# Patient Record
Sex: Female | Born: 1935 | Race: White | Hispanic: No | Marital: Married | Smoking: Never smoker
Health system: Southern US, Community
[De-identification: ages and names within clinical notes are randomized; demographics above are authoritative.]

## PROBLEM LIST (undated history)

## (undated) DIAGNOSIS — G309 Alzheimer's disease, unspecified: Secondary | ICD-10-CM

## (undated) DIAGNOSIS — F0280 Dementia in other diseases classified elsewhere without behavioral disturbance: Secondary | ICD-10-CM

## (undated) DIAGNOSIS — K5900 Constipation, unspecified: Principal | ICD-10-CM

## (undated) DIAGNOSIS — E538 Deficiency of other specified B group vitamins: Secondary | ICD-10-CM

## (undated) HISTORY — DX: Dementia in other diseases classified elsewhere without behavioral disturbance: F02.80

## (undated) HISTORY — DX: Constipation, unspecified: K59.00

## (undated) HISTORY — DX: Deficiency of other specified B group vitamins: E53.8

## (undated) HISTORY — DX: Alzheimer's disease, unspecified: G30.9

---

## 2012-09-27 ENCOUNTER — Non-Acute Institutional Stay (SKILLED_NURSING_FACILITY): Payer: Medicare Other | Admitting: Adult Health

## 2012-09-27 DIAGNOSIS — F29 Unspecified psychosis not due to a substance or known physiological condition: Secondary | ICD-10-CM | POA: Insufficient documentation

## 2012-09-27 DIAGNOSIS — K5900 Constipation, unspecified: Secondary | ICD-10-CM

## 2012-09-27 DIAGNOSIS — E538 Deficiency of other specified B group vitamins: Secondary | ICD-10-CM

## 2012-09-27 DIAGNOSIS — G309 Alzheimer's disease, unspecified: Secondary | ICD-10-CM

## 2012-09-27 DIAGNOSIS — F0280 Dementia in other diseases classified elsewhere without behavioral disturbance: Secondary | ICD-10-CM

## 2012-10-17 ENCOUNTER — Non-Acute Institutional Stay (SKILLED_NURSING_FACILITY): Payer: Medicare Other | Admitting: Internal Medicine

## 2012-10-17 DIAGNOSIS — K5900 Constipation, unspecified: Secondary | ICD-10-CM

## 2012-10-17 DIAGNOSIS — Z79899 Other long term (current) drug therapy: Secondary | ICD-10-CM

## 2012-10-17 DIAGNOSIS — E538 Deficiency of other specified B group vitamins: Secondary | ICD-10-CM

## 2012-10-17 DIAGNOSIS — G309 Alzheimer's disease, unspecified: Secondary | ICD-10-CM

## 2012-11-06 NOTE — Progress Notes (Signed)
Patient ID: Felicia Mcdonald, female   DOB: 1933/01/09, 79 y.o.   MRN: [id number]        PROGRESS NOTE  DATE: 10/17/2012  FACILITY:  [facility]  LEVEL OF CARE: SNF (31)  Routine Visit  CHIEF COMPLAINT:  Manage dementia and vitamin B12 deficiency.    HISTORY OF PRESENT ILLNESS:   REASSESSMENT OF ONGOING PROBLEM(S):  DEMENTIA: The dementia remains stable and continues to function adequately in the current living environment with supervision.  The patient has had little changes in behavior. No complications noted from the medications presently being used. Patient is a poor historian.    VITAMIN B12 DEFICIENCY: The B12 deficiency remains stable.  Staff denies paresthesias or gait & balance problems.  No complications reported from the B12 supplementation.    PAST MEDICAL HISTORY : Reviewed.  No changes.  CURRENT MEDICATIONS: Reviewed per [facility]  REVIEW OF SYSTEMS:  Unobtainable due to dementia.    PHYSICAL EXAMINATION  VS:  T 97       P 76       RR 20      BP 108/60     POX %       WT (Lb) 130  GENERAL: no acute distress, normal body habitus NECK: supple, trachea midline, no neck masses, no thyroid tenderness, no thyromegaly RESPIRATORY: breathing is even & unlabored, BS CTAB CARDIAC: RRR, no murmur,no extra heart sounds, no edema GI: abdomen soft, normal BS, no masses, no tenderness, no hepatomegaly, no splenomegaly PSYCHIATRIC: the patient is alert, disoriented, affect & behavior appropriate  LABS/RADIOLOGY: 07/2012:  Platelets 144, otherwise CBC normal.    CMP normal.   Hemoglobin A1C 5.8.     04/2012:  Depakote level 22.    ASSESSMENT/PLAN:  Alzheimer's dementia.  Advanced.  Vitamin B12 deficiency 266.2.  Continue supplementation.    Constipation.  No problems reported by the staff.    Check Depakote level.   CPT CODE: [id number]

## 2012-11-07 ENCOUNTER — Non-Acute Institutional Stay (SKILLED_NURSING_FACILITY): Payer: Medicare Other | Admitting: Internal Medicine

## 2012-11-07 DIAGNOSIS — K5900 Constipation, unspecified: Secondary | ICD-10-CM

## 2012-11-07 DIAGNOSIS — F0280 Dementia in other diseases classified elsewhere without behavioral disturbance: Secondary | ICD-10-CM

## 2012-11-07 DIAGNOSIS — E538 Deficiency of other specified B group vitamins: Secondary | ICD-10-CM

## 2012-11-07 DIAGNOSIS — Z79899 Other long term (current) drug therapy: Secondary | ICD-10-CM | POA: Insufficient documentation

## 2012-11-07 DIAGNOSIS — G309 Alzheimer's disease, unspecified: Secondary | ICD-10-CM

## 2012-11-07 HISTORY — DX: Deficiency of other specified B group vitamins: E53.8

## 2012-11-07 HISTORY — DX: Dementia in other diseases classified elsewhere, unspecified severity, without behavioral disturbance, psychotic disturbance, mood disturbance, and anxiety: F02.80

## 2012-11-07 HISTORY — DX: Constipation, unspecified: K59.00

## 2012-11-07 NOTE — Progress Notes (Signed)
Patient ID: Felicia Mcdonald, female   DOB: August 03, 1932, 79 y.o.   MRN: [id number]        PROGRESS NOTE  DATE: 11/07/2012  FACILITY:  [facility]  LEVEL OF CARE: SNF (31)  Routine Visit  CHIEF COMPLAINT:  Manage dementia and vitamin B12 deficiency.    HISTORY OF PRESENT ILLNESS:   REASSESSMENT OF ONGOING PROBLEM(S):  DEMENTIA: The dementia remains stable and continues to function adequately in the current living environment with supervision.  The patient has had little changes in behavior. No complications noted from the medications presently being used. Patient is a poor historian.    VITAMIN B12 DEFICIENCY: The B12 deficiency remains stable.  Staff deny paresthesias or gait & balance problems.  No complications reported from the B12 supplementation.    PAST MEDICAL HISTORY : Reviewed.  No changes.  CURRENT MEDICATIONS: Reviewed per [facility]  REVIEW OF SYSTEMS:  Unobtainable due to dementia.    PHYSICAL EXAMINATION  VS:  T 98.1       P 96       RR 18      BP 142/82     POX %       WT (Lb) 127  GENERAL: no acute distress, normal body habitus NECK: supple, trachea midline, no neck masses, no thyroid tenderness, no thyromegaly RESPIRATORY: breathing is even & unlabored, BS CTAB CARDIAC: RRR, no murmur,no extra heart sounds, no edema GI: abdomen soft, normal BS, no masses, no tenderness, no hepatomegaly, no splenomegaly PSYCHIATRIC: the patient is alert, disoriented, affect & behavior appropriate  LABS/RADIOLOGY: 5/14 Depakote level 31 07/2012:  Platelets 144, otherwise CBC normal.    CMP normal.   Hemoglobin A1C 5.8.     04/2012:  Depakote level 22.    ASSESSMENT/PLAN:  Alzheimer's dementia.  Advanced.  Vitamin B12 deficiency 266.2.  Continue supplementation.    Constipation.  No problems reported by the staff.    CPT CODE: 40981

## 2012-12-12 ENCOUNTER — Non-Acute Institutional Stay (SKILLED_NURSING_FACILITY): Payer: Medicare Other | Admitting: Internal Medicine

## 2012-12-12 DIAGNOSIS — G309 Alzheimer's disease, unspecified: Secondary | ICD-10-CM

## 2012-12-12 DIAGNOSIS — E538 Deficiency of other specified B group vitamins: Secondary | ICD-10-CM

## 2012-12-12 DIAGNOSIS — K5900 Constipation, unspecified: Secondary | ICD-10-CM

## 2012-12-14 NOTE — Progress Notes (Signed)
Patient ID: Felicia Mcdonald, female   DOB: March 17, 1933, 79 y.o.   MRN: [id number]        PROGRESS NOTE  DATE: 12/12/2012  FACILITY:  [facility]  LEVEL OF CARE: SNF (31)  Routine Visit  CHIEF COMPLAINT:  Manage dementia and vitamin B12 deficiency.    HISTORY OF PRESENT ILLNESS:   REASSESSMENT OF ONGOING PROBLEM(S):  DEMENTIA: The dementia remains stable and continues to function adequately in the current living environment with supervision.  The patient has had little changes in behavior. No complications noted from the medications presently being used. Patient is a poor historian.    VITAMIN B12 DEFICIENCY: The B12 deficiency remains stable.  Staff deny paresthesias or gait & balance problems.  No complications reported from the B12 supplementation.    PAST MEDICAL HISTORY : Reviewed.  No changes.  CURRENT MEDICATIONS: Reviewed per [facility]  REVIEW OF SYSTEMS:  Unobtainable due to dementia.    PHYSICAL EXAMINATION  VS:  T 98.1       P 80       RR 24      BP 100/68     POX %       WT (Lb) 125  GENERAL: no acute distress, normal body habitus NECK: supple, trachea midline, no neck masses, no thyroid tenderness, no thyromegaly RESPIRATORY: breathing is even & unlabored, BS CTAB CARDIAC: RRR, no murmur,no extra heart sounds, no edema GI: abdomen soft, normal BS, no masses, no tenderness, no hepatomegaly, no splenomegaly PSYCHIATRIC: the patient is alert, disoriented, affect & behavior appropriate  LABS/RADIOLOGY: 5/14 Depakote level 31 07/2012:  Platelets 144, otherwise CBC normal.    CMP normal.   Hemoglobin A1C 5.8.     04/2012:  Depakote level 22.    ASSESSMENT/PLAN:  Alzheimer's dementia.  Advanced.  Vitamin B12 deficiency 266.2.  Continue supplementation.    Constipation.  No problems reported by the staff.    CPT CODE: [id number]

## 2013-01-09 ENCOUNTER — Non-Acute Institutional Stay (SKILLED_NURSING_FACILITY): Payer: Medicare Other | Admitting: Internal Medicine

## 2013-01-09 DIAGNOSIS — G309 Alzheimer's disease, unspecified: Secondary | ICD-10-CM

## 2013-01-09 DIAGNOSIS — E538 Deficiency of other specified B group vitamins: Secondary | ICD-10-CM

## 2013-01-09 DIAGNOSIS — Z79899 Other long term (current) drug therapy: Secondary | ICD-10-CM

## 2013-01-09 DIAGNOSIS — F0280 Dementia in other diseases classified elsewhere without behavioral disturbance: Secondary | ICD-10-CM

## 2013-01-09 DIAGNOSIS — K5900 Constipation, unspecified: Secondary | ICD-10-CM

## 2013-01-09 NOTE — Progress Notes (Signed)
Patient ID: Felicia Mcdonald, female   DOB: Sep 20, 1932, 79 y.o.   MRN: [id number]        PROGRESS NOTE  DATE: 01/09/2013  FACILITY:  [facility]  LEVEL OF CARE: SNF (31)  Routine Visit  CHIEF COMPLAINT:  Manage dementia and vitamin B12 deficiency.    HISTORY OF PRESENT ILLNESS:   REASSESSMENT OF ONGOING PROBLEM(S):  DEMENTIA: The dementia remains stable and continues to function adequately in the current living environment with supervision.  The patient has had little changes in behavior. No complications noted from the medications presently being used. Patient is a poor historian.    VITAMIN B12 DEFICIENCY: The B12 deficiency remains stable.  Staff deny paresthesias or gait & balance problems.  No complications reported from the B12 supplementation.    PAST MEDICAL HISTORY : Reviewed.  No changes.  CURRENT MEDICATIONS: Reviewed per [facility]  REVIEW OF SYSTEMS:  Unobtainable due to dementia.    PHYSICAL EXAMINATION  VS:  T 96.4       P 82       RR 16      BP 102/66     POX %       WT (Lb) 125  GENERAL: no acute distress, normal body habitus NECK: supple, trachea midline, no neck masses, no thyroid tenderness, no thyromegaly RESPIRATORY: breathing is even & unlabored, BS CTAB CARDIAC: RRR, no murmur,no extra heart sounds, no edema GI: abdomen soft, normal BS, no masses, no tenderness, no hepatomegaly, no splenomegaly PSYCHIATRIC: the patient is alert, disoriented, affect & behavior appropriate  LABS/RADIOLOGY: 5/14 Depakote level 31 07/2012:  Platelets 144, otherwise CBC normal.    CMP normal.   Hemoglobin A1C 5.8.     04/2012:  Depakote level 22.    ASSESSMENT/PLAN:  Alzheimer's dementia.  Advanced.  Vitamin B12 deficiency 266.2.  Continue supplementation.    Constipation.  No problems reported by the staff.    Check cbc & cmp.  CPT CODE: [id number]

## 2013-02-12 ENCOUNTER — Non-Acute Institutional Stay (SKILLED_NURSING_FACILITY): Payer: Medicare Other | Admitting: Adult Health

## 2013-02-12 DIAGNOSIS — E538 Deficiency of other specified B group vitamins: Secondary | ICD-10-CM

## 2013-02-12 DIAGNOSIS — K5900 Constipation, unspecified: Secondary | ICD-10-CM

## 2013-02-12 DIAGNOSIS — F29 Unspecified psychosis not due to a substance or known physiological condition: Secondary | ICD-10-CM

## 2013-02-12 DIAGNOSIS — F0280 Dementia in other diseases classified elsewhere without behavioral disturbance: Secondary | ICD-10-CM

## 2013-03-14 ENCOUNTER — Non-Acute Institutional Stay (SKILLED_NURSING_FACILITY): Payer: Medicare Other | Admitting: Internal Medicine

## 2013-03-14 DIAGNOSIS — F0280 Dementia in other diseases classified elsewhere without behavioral disturbance: Secondary | ICD-10-CM

## 2013-03-14 DIAGNOSIS — K5900 Constipation, unspecified: Secondary | ICD-10-CM

## 2013-03-14 DIAGNOSIS — E538 Deficiency of other specified B group vitamins: Secondary | ICD-10-CM

## 2013-03-22 NOTE — Progress Notes (Signed)
Patient ID: Felicia Mcdonald, female   DOB: Jan 08, 1933, 79 y.o.   MRN: [id number]        PROGRESS NOTE  DATE: 03/14/2013  FACILITY:  [facility]  LEVEL OF CARE: SNF (31)  Routine Visit  CHIEF COMPLAINT:  Manage dementia and vitamin B12 deficiency.    HISTORY OF PRESENT ILLNESS:   REASSESSMENT OF ONGOING PROBLEM(S):  DEMENTIA: The dementia remains stable and continues to function adequately in the current living environment with supervision.  The patient has had little changes in behavior. No complications noted from the medications presently being used. Patient is a poor historian.    VITAMIN B12 DEFICIENCY: The B12 deficiency remains stable.  Staff deny paresthesias or gait & balance problems.  No complications reported from the B12 supplementation.    PAST MEDICAL HISTORY : Reviewed.  No changes.  CURRENT MEDICATIONS: Reviewed per [facility]  REVIEW OF SYSTEMS:  Unobtainable due to dementia.    PHYSICAL EXAMINATION  VS:  T 97.3       P 68       RR 16      BP 110/76     POX %       WT (Lb) 124  GENERAL: no acute distress, normal body habitus NECK: supple, trachea midline, no neck masses, no thyroid tenderness, no thyromegaly RESPIRATORY: breathing is even & unlabored, BS CTAB CARDIAC: RRR, no murmur,no extra heart sounds, no edema GI: abdomen soft, normal BS, no masses, no tenderness, no hepatomegaly, no splenomegaly PSYCHIATRIC: the patient is alert, disoriented, affect & behavior appropriate  LABS/RADIOLOGY:  8/14 cbc & cmp nl, HbA1c 6.3 5/14 Depakote level 31 07/2012:  Platelets 144, otherwise CBC normal.    CMP normal.   Hemoglobin A1C 5.8.     04/2012:  Depakote level 22.    ASSESSMENT/PLAN:  Alzheimer's dementia.  Advanced.  Vitamin B12 deficiency 266.2.  Continue supplementation.    Constipation.  No problems reported by the staff.    CPT CODE: [id number]

## 2013-04-11 ENCOUNTER — Non-Acute Institutional Stay (SKILLED_NURSING_FACILITY): Payer: Medicare Other | Admitting: Internal Medicine

## 2013-04-11 DIAGNOSIS — K5900 Constipation, unspecified: Secondary | ICD-10-CM

## 2013-04-11 DIAGNOSIS — E538 Deficiency of other specified B group vitamins: Secondary | ICD-10-CM

## 2013-04-11 DIAGNOSIS — F0280 Dementia in other diseases classified elsewhere without behavioral disturbance: Secondary | ICD-10-CM

## 2013-04-11 DIAGNOSIS — R21 Rash and other nonspecific skin eruption: Secondary | ICD-10-CM

## 2013-04-12 DIAGNOSIS — R21 Rash and other nonspecific skin eruption: Secondary | ICD-10-CM | POA: Insufficient documentation

## 2013-04-12 NOTE — Progress Notes (Signed)
Patient ID: Felicia Mcdonald, female   DOB: 04/01/1933, 79 y.o.   MRN: [id number]        PROGRESS NOTE  DATE: 04/11/2013  FACILITY:  [facility]  LEVEL OF CARE: SNF (31)  Routine Visit  CHIEF COMPLAINT:  Manage dementia, facial rash and vitamin B12 deficiency.    HISTORY OF PRESENT ILLNESS:   REASSESSMENT OF ONGOING PROBLEM(S):  FACIAL RASH: new problem.  Per staff pt has been having a rash on her face x several days.  No itching or other discomfort reported.  Staff cannot identify precipitating or alleviating factors.  There is no temporal relationship.  DEMENTIA: The dementia remains stable and continues to function adequately in the current living environment with supervision.  The patient has had little changes in behavior. No complications noted from the medications presently being used. Patient is a poor historian.    VITAMIN B12 DEFICIENCY: The B12 deficiency remains stable.  Staff deny paresthesias or gait & balance problems.  No complications reported from the B12 supplementation.    PAST MEDICAL HISTORY : Reviewed.  No changes.  CURRENT MEDICATIONS: Reviewed per [facility]  REVIEW OF SYSTEMS:  Unobtainable due to dementia.    PHYSICAL EXAMINATION  VS:  T 98.1       P 80       RR 18      BP 112/80     POX %       WT (Lb) 124  GENERAL: no acute distress, normal body habitus EYES: normal conjunctivae, normal sclerae, no discharge SKIN: erythematous 1mm size lesions on face LYMPHATICS: no cervical LAN, no supraclavicular LAN NECK: supple, trachea midline, no neck masses, no thyroid tenderness, no thyromegaly RESPIRATORY: breathing is even & unlabored, BS CTAB CARDIAC: RRR, no murmur,no extra heart sounds, no edema GI: abdomen soft, normal BS, no masses, no tenderness, no hepatomegaly, no splenomegaly PSYCHIATRIC: the patient is alert, disoriented, affect & behavior appropriate  LABS/RADIOLOGY:  8/14 cbc & cmp nl, HbA1c 6.3 5/14 Depakote level 31 07/2012:   Platelets 144, otherwise CBC normal.    CMP normal.   Hemoglobin A1C 5.8.     04/2012:  Depakote level 22.    ASSESSMENT/PLAN:  Alzheimer's dementia.  Advanced.  Rash.  New problem.  No discomfort.  Unlikely from meds.  Vitamin B12 deficiency 266.2.  Continue supplementation.    Constipation.  No problems reported by the staff.    CPT CODE: 40981

## 2013-05-06 ENCOUNTER — Encounter: Payer: Self-pay | Admitting: Adult Health

## 2013-05-06 NOTE — Progress Notes (Signed)
Patient ID: Felicia Mcdonald, female   DOB: 27-Nov-1932, 79 y.o.   MRN: [id number]     [facility]  No Known Allergies   Chief Complaint  Patient presents with  . Medical Managment of Chronic Issues    HPI:  She is being seen for the management of her chronic illnesses. Overall her status has not had a recent change. There are no concerns being voiced by the nursing staff at this time. She is unable to fully participate in the hpi or ros at this time.   Past Medical History  Diagnosis Date  . Unspecified constipation 11/07/2012  . Alzheimer's disease 11/07/2012  . Other B-complex deficiencies 11/07/2012    No past surgical history on file.     VITAL SIGNS BP 118/75  Pulse 69  Wt 130 lb (58.968 kg)   Patient's Medications  New Prescriptions   No medications on file  Previous Medications   CYANOCOBALAMIN (VITAMIN B-12 IJ)    Inject 1 mL as directed every 30 (thirty) days.   DIVALPROEX (DEPAKOTE SPRINKLE) 125 MG CAPSULE    Take 250 mg by mouth at bedtime.   DOCUSATE SODIUM (COLACE) 100 MG CAPSULE    Take 100 mg by mouth daily.   QUETIAPINE (SEROQUEL) 50 MG TABLET    Take 50 mg by mouth 2 (two) times daily.  Modified Medications   No medications on file  Discontinued Medications   No medications on file    SIGNIFICANT DIAGNOSTIC EXAMS  LABS REVIEWED:  07-12-12: wbc 5.2; hgb 11.2; hct 34.1; mcv 90; plt 144; glucose 95; bun 20; creat0.95; k+4.9; na++144 Liver normal albumin 4.0; hgb a1c 5.8    Review of Systems  Unable to perform ROS   Physical Exam  Constitutional: She appears well-nourished. No distress.  Neck: Neck supple. No JVD present.  Cardiovascular: Normal rate, regular rhythm and intact distal pulses.   GI: Soft. Bowel sounds are normal. She exhibits no distension. There is no tenderness.  Musculoskeletal: She exhibits no edema.  Is able to move extremities  Neurological: She is alert.  Skin: Skin is warm and dry. She is not diaphoretic.      ASSESSMENT/PLAN  1. Alzheimer's disease: she is without change in her status: will continue she is preset not taking medications for this disease process will continue to monitor her status and will make changes as indicated.   2. Psychosis: no reports of behavorial issues present. Will continue her seroquel 50 mg twice daily and will continue depakote 250 mg nightly to help stabilize mood and will monitor  3. Constipation: will continue colace  4. B12 deficiency will continue monthly injections

## 2013-05-09 ENCOUNTER — Non-Acute Institutional Stay (SKILLED_NURSING_FACILITY): Payer: Medicare Other | Admitting: Internal Medicine

## 2013-05-09 DIAGNOSIS — F0280 Dementia in other diseases classified elsewhere without behavioral disturbance: Secondary | ICD-10-CM

## 2013-05-09 DIAGNOSIS — K5900 Constipation, unspecified: Secondary | ICD-10-CM

## 2013-05-09 DIAGNOSIS — E538 Deficiency of other specified B group vitamins: Secondary | ICD-10-CM

## 2013-05-09 DIAGNOSIS — Z79899 Other long term (current) drug therapy: Secondary | ICD-10-CM

## 2013-05-10 ENCOUNTER — Encounter: Payer: Self-pay | Admitting: Internal Medicine

## 2013-05-10 NOTE — Progress Notes (Signed)
Patient ID: Felicia Mcdonald, female   DOB: [birth year], 79 y.o.   MRN: [id number]        PROGRESS NOTE  DATE: 05/09/2013  FACILITY:  [facility]  LEVEL OF CARE: SNF (31)  Routine Visit  CHIEF COMPLAINT:  Manage dementia, and vitamin B12 deficiency.    HISTORY OF PRESENT ILLNESS:   REASSESSMENT OF ONGOING PROBLEM(S):  DEMENTIA: The dementia remains stable and continues to function adequately in the current living environment with supervision.  The patient has had little changes in behavior. No complications noted from the medications presently being used. Patient is a poor historian.    VITAMIN B12 DEFICIENCY: The B12 deficiency remains stable.  Staff deny paresthesias or gait & balance problems.  No complications reported from the B12 supplementation.    PAST MEDICAL HISTORY : Reviewed.  No changes.  CURRENT MEDICATIONS: Reviewed per [facility]  REVIEW OF SYSTEMS:  Unobtainable due to dementia.    PHYSICAL EXAMINATION  VS:  T 96.7       P 74    RR 18      BP 110/84     POX %       WT (Lb) 124  GENERAL: no acute distress, normal body habitus NECK: supple, trachea midline, no neck masses, no thyroid tenderness, no thyromegaly RESPIRATORY: breathing is even & unlabored, BS CTAB CARDIAC: RRR, no murmur,no extra heart sounds, no edema GI: abdomen soft, normal BS, no masses, no tenderness, no hepatomegaly, no splenomegaly PSYCHIATRIC: the patient is alert, disoriented, affect & behavior appropriate  LABS/RADIOLOGY:  8/14 cbc & cmp nl, HbA1c 6.3 5/14 Depakote level 31 07/2012:  Platelets 144, otherwise CBC normal.    CMP normal.   Hemoglobin A1C 5.8.     04/2012:  Depakote level 22.    ASSESSMENT/PLAN:  Alzheimer's dementia.  Advanced. Seroquel was decreased.  Vitamin B12 deficiency 266.2.  Continue supplementation.    Constipation.  No problems reported by the staff.    Check Depakote level.  CPT CODE: [id number]

## 2013-05-16 ENCOUNTER — Encounter: Payer: Self-pay | Admitting: Adult Health

## 2013-05-16 NOTE — Progress Notes (Signed)
Patient ID: Felicia Mcdonald, female   DOB: 1932-12-18, 79 y.o.   MRN: [id number]     [facility]  No Known Allergies  Chief Complaint  Patient presents with  . Medical Managment of Chronic Issues    HPI  She is being seen for the management of her chronic illnesses. There are no concerns being voiced by the nursing staff at this time. She is unable to fully participate in the hpi or ros. There has been no significant change in her recent status.   Past Medical History  Diagnosis Date  . Unspecified constipation 11/07/2012  . Alzheimer's disease 11/07/2012  . Other B-complex deficiencies 11/07/2012    No past surgical history on file.  Filed Vitals:   02/12/13 2101  BP: 120/76  Pulse: 70  Height: 5\' 1"  (1.549 m)  Weight: 124 lb (56.246 kg)    MEDICATIONS  Colace daily seroquel 50 mg twice daily depakote sprinkles 250 mg nightly Vit b injection monthly   SIGNIFICANT DIAGNOSTIC EXAMS  LABS REVIEWED:  07-12-12: wbc 5.2; hgb 11.2; hct 34.1; mcv 90; plt 144; glucose 95; bun 20; creat0.95; k+4.9; na++144 Liver normal albumin 4.0; hgb a1c 5.8  10-25-12: depakote 31 01-16-13: wbc 5.3; hgb 11.6; hct 34.7; mcv 87 plt 212; glucose 84; bun 16; creat 0.97; k+4.4; na++ 144; liver normal albumin 4.2; hgb a1c 6.3    Review of Systems  Unable to perform ROS   Physical Exam  Constitutional: She appears well-nourished. No distress.  Neck: Neck supple. No JVD present.  Cardiovascular: Normal rate, regular rhythm and intact distal pulses.   GI: Soft. Bowel sounds are normal. She exhibits no distension. There is no tenderness.  Musculoskeletal: She exhibits no edema.  Is able to move extremities  Neurological: She is alert.  Skin: Skin is warm and dry. She is not diaphoretic.     ASSESSMENT/PLAN  1. Alzheimer's disease: she is without change in her status: will continue she is preset not taking medications for this disease process will continue to monitor her status and will make  changes as indicated.   2. Psychosis: no reports of behavorial issues present. Will continue her seroquel 50 mg twice daily and will continue depakote 250 mg nightly to help stabilize mood and will monitor  3. Constipation: will continue colace daily   4. B12 deficiency will continue monthly injections

## 2013-06-03 ENCOUNTER — Non-Acute Institutional Stay (SKILLED_NURSING_FACILITY): Payer: Medicare Other | Admitting: Family

## 2013-06-03 ENCOUNTER — Encounter: Payer: Self-pay | Admitting: Family

## 2013-06-03 DIAGNOSIS — K5900 Constipation, unspecified: Secondary | ICD-10-CM

## 2013-06-03 DIAGNOSIS — F0280 Dementia in other diseases classified elsewhere without behavioral disturbance: Secondary | ICD-10-CM

## 2013-06-03 DIAGNOSIS — E538 Deficiency of other specified B group vitamins: Secondary | ICD-10-CM

## 2013-06-03 NOTE — Progress Notes (Signed)
    Patient ID: Felicia Mcdonald, female   DOB: 28-May-1933, 79 y.o.   MRN: [id number]  Date: 06/03/13 Facility: [facility]  Code Status:  DNR  Chief Complaint  Patient presents with  . Medical Managment of Chronic Issues    Routine Visit    HPI: Pt is being followed for medical management of chronic illnesses. Health care team and pt denies issues/ concerns at present      No Known Allergies    Medication List       This list is accurate as of: 06/03/13  4:35 PM.  Always use your most recent med list.               divalproex 125 MG capsule  Commonly known as:  DEPAKOTE SPRINKLE  Take 250 mg by mouth at bedtime.     docusate sodium 100 MG capsule  Commonly known as:  COLACE  Take 100 mg by mouth daily.     QUEtiapine 50 MG tablet  Commonly known as:  SEROQUEL  Take 50 mg by mouth 2 (two) times daily.     VITAMIN B-12 IJ  Inject 1 mL as directed every 30 (thirty) days.         DATA REVIEWED  Laboratory Studies: 05/15/13-Valproic 24 01/16/13-WBC 5.3, Hemoglobin 11.6, Hematocrit 34.7, Platelets 212, BUN 16, Creatinine 0.97, Na 144, K 4.4, Cl 104, Ca 9.5 Past Medical History  Diagnosis Date  . Unspecified constipation 11/07/2012  . Alzheimer's disease 11/07/2012  . Other B-complex deficiencies 11/07/2012   Review of Systems  Unable to perform ROS: dementia     Physical Exam Filed Vitals:   06/03/13 1631  BP: 128/84  Pulse: 76  Temp: 97.9 F (36.6 C)  Resp: 16   There is no weight on file to calculate BMI. Physical Exam  Cardiovascular: Normal rate, regular rhythm and normal heart sounds.   Pulmonary/Chest: Effort normal and breath sounds normal.  Neurological: She is alert. GCS eye subscore is 4. GCS verbal subscore is 4. GCS motor subscore is 6.  Pleasantly confused  Skin: Skin is warm, dry and intact.    ASSESSMENT/PLAN  Vitamin B12 def.-obtain Vitamin B12/ folate , will continue supplementation Depression-will continue Seroquel; pt is stable   Mood Disorder-will cont. Depakote; pt is stable   Follow up:prn

## 2013-06-13 ENCOUNTER — Non-Acute Institutional Stay (SKILLED_NURSING_FACILITY): Payer: Medicare Other | Admitting: Internal Medicine

## 2013-06-13 DIAGNOSIS — E559 Vitamin D deficiency, unspecified: Secondary | ICD-10-CM

## 2013-06-14 DIAGNOSIS — E559 Vitamin D deficiency, unspecified: Secondary | ICD-10-CM | POA: Insufficient documentation

## 2013-06-14 NOTE — Progress Notes (Signed)
         PROGRESS NOTE  DATE: 06/13/2013  FACILITY:  [facility]  LEVEL OF CARE: SNF (31)  Acute Visit  CHIEF COMPLAINT:  Manage vitamin D deficiency  HISTORY OF PRESENT ILLNESS: I was requested by the staff to assess the patient regarding above problem(s):  In 12-14 vitamin D level was 17.8. Patient is not on vitamin D supplementation. She is a poor historian due to dementia. Staff did not report any acute symptoms.  PAST MEDICAL HISTORY : Reviewed.  No changes.  [facility]  PHYSICAL EXAMINATION  GENERAL: no acute distress, normal body habitus RESPIRATORY: breathing is even & unlabored, BS CTAB CARDIAC: RRR, no murmur,no extra heart sounds, no edema  LABS/RADIOLOGY: See history of present illness  ASSESSMENT/PLAN:  Vitamin D deficiency-new problem. Start vitamin D2 50,000 units q. monthly.  CPT CODE: [id number]

## 2013-07-11 ENCOUNTER — Non-Acute Institutional Stay (SKILLED_NURSING_FACILITY): Payer: Medicare Other | Admitting: Internal Medicine

## 2013-07-11 DIAGNOSIS — E538 Deficiency of other specified B group vitamins: Secondary | ICD-10-CM

## 2013-07-11 DIAGNOSIS — K5900 Constipation, unspecified: Secondary | ICD-10-CM

## 2013-07-11 DIAGNOSIS — E7800 Pure hypercholesterolemia, unspecified: Secondary | ICD-10-CM

## 2013-07-11 DIAGNOSIS — F0280 Dementia in other diseases classified elsewhere without behavioral disturbance: Secondary | ICD-10-CM

## 2013-07-11 DIAGNOSIS — G309 Alzheimer's disease, unspecified: Secondary | ICD-10-CM

## 2013-07-12 DIAGNOSIS — E7800 Pure hypercholesterolemia, unspecified: Secondary | ICD-10-CM | POA: Insufficient documentation

## 2013-07-12 NOTE — Progress Notes (Signed)
Patient ID: Felicia CorriganBetty Mcdonald, female   DOB: 1932-06-09, 80 y.o.   MRN: [id number]         PROGRESS NOTE  DATE: 07/11/2013  [facility]  LEVEL OF CARE: SNF (31)  Routine Visit  CHIEF COMPLAINT:  Manage hyperlipidemia, dementia, and vitamin B12 deficiency.    HISTORY OF PRESENT ILLNESS:   REASSESSMENT OF ONGOING PROBLEM(S):  DEMENTIA: The dementia remains stable and continues to function adequately in the current living environment with supervision.  The patient has had little changes in behavior. No complications noted from the medications presently being used. Patient is a poor historian.    VITAMIN B12 DEFICIENCY: The B12 deficiency remains stable.  Staff deny paresthesias or gait & balance problems.  No complications reported from the B12 supplementation.    HYPERLIPIDEMIA: On 07-10-13 total cholesterol 232, triglycerides 174, HDL 36, LDL 161. Patient is currently not on any lipid lowering agents.  PAST MEDICAL HISTORY : Reviewed.  No changes.  [facility]  REVIEW OF SYSTEMS:  Unobtainable due to dementia.    PHYSICAL EXAMINATION  VS:  T 97.1       P 66    RR 18      BP 121/80       WT (Lb) 122  GENERAL: no acute distress, normal body habitus EYES: Normal sclerae, normal conjunctivae, no discharge NECK: supple, trachea midline, no neck masses, no thyroid tenderness, no thyromegaly LYMPHATICS: No cervical lymphadenopathy, no supraclavicular lymphadenopathy RESPIRATORY: breathing is even & unlabored, BS CTAB CARDIAC: RRR, no murmur,no extra heart sounds, no edema GI: abdomen soft, normal BS, no masses, no tenderness, no hepatomegaly, no splenomegaly PSYCHIATRIC: the patient is alert, disoriented, affect & behavior appropriate  LABS/RADIOLOGY: 12-14 vitamin D level 17.8, vitamin B12 level 379, folate level 12, Depakote level 24 8/14 cbc & cmp nl, HbA1c 6.3 5/14 Depakote level 31 07/2012:  Platelets 144, otherwise CBC normal.     CMP normal.   Hemoglobin A1C 5.8.     04/2012:  Depakote level 22.    ASSESSMENT/PLAN:       Hyperlipidemia-due to advanced age and dementia will not initiate treatment. Request to discontinue lipid panel checks.  Alzheimer's dementia.  Advanced.   Vitamin B12 deficiency 266.2.  Continue supplementation.    Constipation.  No problems reported by the staff.    Vitamin D deficiency-vitamin D was started  Check CBC and CMP  CPT CODE: [id number]

## 2013-07-15 ENCOUNTER — Encounter: Payer: Self-pay | Admitting: *Deleted

## 2013-07-23 ENCOUNTER — Non-Acute Institutional Stay (SKILLED_NURSING_FACILITY): Payer: Medicare Other | Admitting: Internal Medicine

## 2013-07-23 DIAGNOSIS — D638 Anemia in other chronic diseases classified elsewhere: Secondary | ICD-10-CM

## 2013-07-30 NOTE — Progress Notes (Signed)
Patient ID: Felicia CorriganBetty Kertz, female   DOB: [birth year], 80 y.o.   MRN: [id number]          PROGRESS NOTE  DATE: 07/23/2013    [facility]  LEVEL OF CARE: SNF (31)  Acute Visit  CHIEF COMPLAINT:  Manage anemia of chronic disease.    HISTORY OF PRESENT ILLNESS: I was requested by the staff to assess the patient regarding above problem(s):  ANEMIA: The anemia has been stable. The staff denies fatigue, melena or hematochezia.  The patient is currently not on iron.   On 07/17/2013:  Hemoglobin 11, MCV 89.  In 01/2013:  Hemoglobin 11.6.  Patient is a poor historian due to dementia.    PAST MEDICAL HISTORY : Reviewed.  No changes.  [facility]S: [facility]  PHYSICAL EXAMINATION  VS:  T 97.1      P 66     RR 18     BP 121/80        WT (Lb) 122     GENERAL: no acute distress, normal body habitus RESPIRATORY: breathing is even & unlabored, BS CTAB CARDIAC: RRR, no murmur,no extra heart sounds, no edema  ASSESSMENT/PLAN:  Anemia of chronic disease.  Unstable problem.  Hemoglobin declined.  We will monitor.    CPT CODE: 3086599307    Angela CoxGayani Y Bowen Kia, MD [facility] [phone]

## 2013-07-31 DIAGNOSIS — D638 Anemia in other chronic diseases classified elsewhere: Secondary | ICD-10-CM | POA: Insufficient documentation

## 2013-09-03 ENCOUNTER — Non-Acute Institutional Stay (SKILLED_NURSING_FACILITY): Payer: Medicare Other | Admitting: Adult Health

## 2013-09-03 DIAGNOSIS — E559 Vitamin D deficiency, unspecified: Secondary | ICD-10-CM

## 2013-09-03 DIAGNOSIS — F0280 Dementia in other diseases classified elsewhere without behavioral disturbance: Secondary | ICD-10-CM

## 2013-09-03 DIAGNOSIS — F29 Unspecified psychosis not due to a substance or known physiological condition: Secondary | ICD-10-CM

## 2013-09-03 DIAGNOSIS — G309 Alzheimer's disease, unspecified: Principal | ICD-10-CM

## 2013-09-08 ENCOUNTER — Encounter: Payer: Self-pay | Admitting: Adult Health

## 2013-09-08 NOTE — Progress Notes (Signed)
Patient ID: Felicia Mcdonald, female   DOB: 1932-09-29, 80 y.o.   MRN: [id number]     [place]  No Known Allergies   Chief Complaint  Patient presents with  . Medical Managment of Chronic Issues    HPI:  She is being seen for the management of her chronic illnesses. There is no recent change in her overall status. There are no concerns being voiced by the nursing staff at this time. She is not voicing any concerns.    Past Medical History  Diagnosis Date  . Unspecified constipation 11/07/2012  . Alzheimer's disease 11/07/2012  . Other B-complex deficiencies 11/07/2012    No past surgical history on file.  VITAL SIGNS BP 111/86  Pulse 76  Ht _0  (1.549 m)  Wt 120 lb (54.432 kg)  BMI 22.69 kg/m2   Patient's Medications  New Prescriptions   No medications on file  Previous Medications   CYANOCOBALAMIN (VITAMIN B-12 IJ)    Inject 1 mL as directed every 30 (thirty) days.   DIVALPROEX (DEPAKOTE SPRINKLE) 125 MG CAPSULE    Take 250 mg by mouth at bedtime.   DOCUSATE SODIUM (COLACE) 100 MG CAPSULE    Take 100 mg by mouth daily.   ERGOCALCIFEROL (VITAMIN D2) 50000 UNITS CAPSULE    Take 50,000 Units by mouth every 30 (thirty) days.   QUETIAPINE (SEROQUEL) 50 MG TABLET    Take 50 mg by mouth 2 (two) times daily.  Modified Medications   No medications on file  Discontinued Medications   No medications on file    SIGNIFICANT DIAGNOSTIC EXAMS    LABS REVIEWED:   06-04-13: vit b12: 379; folate 12; vit d 17.8 07-21-13: wbc 5.2; hgb 11.0; hct 32.7; mcv 89; plt 198; glucose 85; bun 23; creat 1.00; k+4.6; na++143 alk phos 50; alt 11; ast 19; albumin 4.1; chol 241 ldl 176; trig 126      Review of Systems  Constitutional: Negative.   Respiratory: Negative for cough.   Cardiovascular: Negative for chest pain.  Gastrointestinal: Negative for heartburn and abdominal pain.  Musculoskeletal: Negative for back pain.  Skin: Negative.   Psychiatric/Behavioral: The patient is not  nervous/anxious.      Physical Exam  Constitutional: No distress.  thin  Neck: Neck supple. No JVD present.  Cardiovascular: Normal rate and regular rhythm.   Respiratory: Effort normal and breath sounds normal. No respiratory distress.  GI: Soft. Bowel sounds are normal. She exhibits no distension. There is no tenderness.  Musculoskeletal: Normal range of motion. She exhibits no edema.  Neurological: She is alert.  Skin: Skin is warm and dry. She is not diaphoretic.       ASSESSMENT/ PLAN:   1. .Alzheimer's disease: no significant change in status; is presently not taking medications at this time will not make changes and will monitor  2. Psychosis: no reports of behavioral issues; will continue seroquel 25 mg twice daily and will monitor  3. Vit d deficiency: will continue vit d 50,000 units monthly        Patient's condition is stable; no changes in medications are recommended. We will continue to monitor and make changes as necessary.

## 2013-10-30 ENCOUNTER — Non-Acute Institutional Stay (SKILLED_NURSING_FACILITY): Payer: Medicare Other | Admitting: Internal Medicine

## 2013-10-30 DIAGNOSIS — E559 Vitamin D deficiency, unspecified: Secondary | ICD-10-CM

## 2013-10-30 DIAGNOSIS — K5900 Constipation, unspecified: Secondary | ICD-10-CM

## 2013-10-30 DIAGNOSIS — G309 Alzheimer's disease, unspecified: Principal | ICD-10-CM

## 2013-10-30 DIAGNOSIS — F0280 Dementia in other diseases classified elsewhere without behavioral disturbance: Secondary | ICD-10-CM

## 2013-10-30 DIAGNOSIS — E538 Deficiency of other specified B group vitamins: Secondary | ICD-10-CM

## 2013-10-31 NOTE — Progress Notes (Signed)
Patient ID: Felicia Mcdonald, female   DOB: July 09, 1932, 80 y.o.   MRN: [id number]          PROGRESS NOTE  DATE: 10/30/2013  FACILITY:  [facility]  LEVEL OF CARE: SNF (31)  Routine Visit  CHIEF COMPLAINT:  Manage hyperlipidemia, dementia, and vitamin B12 deficiency.    HISTORY OF PRESENT ILLNESS:   REASSESSMENT OF ONGOING PROBLEM(S):  DEMENTIA: The dementia remains stable and continues to function adequately in the current living environment with supervision.  The patient has had little changes in behavior. No complications noted from the medications presently being used. Patient is a poor historian.    VITAMIN B12 DEFICIENCY: The B12 deficiency remains stable.  Staff deny paresthesias or gait & balance problems.  No complications reported from the B12 supplementation.    HYPERLIPIDEMIA: On 07-10-13 total cholesterol 232, triglycerides 174, HDL 36, LDL 161. Patient is currently not on any lipid lowering agents.  PAST MEDICAL HISTORY : Reviewed.  No changes.  CURRENT MEDICATIONS: Reviewed per [facility]  REVIEW OF SYSTEMS:  Unobtainable due to dementia.    PHYSICAL EXAMINATION  VS: See vital signs section  GENERAL: no acute distress, normal body habitus EYES: Normal sclerae, normal conjunctivae, no discharge NECK: supple, trachea midline, no neck masses, no thyroid tenderness, no thyromegaly LYMPHATICS: No cervical lymphadenopathy, no supraclavicular lymphadenopathy RESPIRATORY: breathing is even & unlabored, BS CTAB CARDIAC: RRR, no murmur,no extra heart sounds, no edema GI: abdomen soft, normal BS, no masses, no tenderness, no hepatomegaly, no splenomegaly PSYCHIATRIC: the patient is alert, disoriented, affect & behavior appropriate  LABS/RADIOLOGY: 4-15 hemoglobin A1c 6 2-15 hemoglobin 11, MCV 89 otherwise CBC normal, CMP normal 12-14 vitamin D level 17.8, vitamin B12 level 379, folate level 12, Depakote level 24 8/14 cbc & cmp nl, HbA1c 6.3 5/14 Depakote level  31 07/2012:  Platelets 144, otherwise CBC normal.    CMP normal.   Hemoglobin A1C 5.8.     04/2012:  Depakote level 22.    ASSESSMENT/PLAN:       Alzheimer's dementia.  Advanced. Seroquel was discontinued and Depakote started  Vitamin B12 deficiency 266.2.  Continue supplementation.    Constipation.  No problems reported by the staff.    Vitamin D deficiency-vitamin D was started  Anemia-stable  Check Depakote level  CPT CODE: 75449  Newton Pigg. Kerry Dory, MD [facility] [phone]

## 2013-11-20 ENCOUNTER — Non-Acute Institutional Stay (SKILLED_NURSING_FACILITY): Payer: Medicare Other | Admitting: Internal Medicine

## 2013-11-20 DIAGNOSIS — K5900 Constipation, unspecified: Secondary | ICD-10-CM

## 2013-11-20 DIAGNOSIS — F0280 Dementia in other diseases classified elsewhere without behavioral disturbance: Secondary | ICD-10-CM

## 2013-11-20 DIAGNOSIS — E559 Vitamin D deficiency, unspecified: Secondary | ICD-10-CM

## 2013-11-20 DIAGNOSIS — G309 Alzheimer's disease, unspecified: Principal | ICD-10-CM

## 2013-11-20 DIAGNOSIS — E538 Deficiency of other specified B group vitamins: Secondary | ICD-10-CM

## 2013-11-21 NOTE — Progress Notes (Signed)
Patient ID: Felicia CorriganBetty Mcdonald, female   DOB: Aug 26, 1932, 80 y.o.   MRN: [id number]         PROGRESS NOTE  DATE: 11/20/2013  [facility]  LEVEL OF CARE: SNF (31)  Routine Visit  CHIEF COMPLAINT:  Manage dementia, and vitamin B12 deficiency.    HISTORY OF PRESENT ILLNESS:   REASSESSMENT OF ONGOING PROBLEM(S):  DEMENTIA: The dementia remains stable and continues to function adequately in the current living environment with supervision.  The patient has had little changes in behavior. No complications noted from the medications presently being used. Patient is a poor historian.    VITAMIN B12 DEFICIENCY: The B12 deficiency remains stable.  Staff deny paresthesias or gait & balance problems.  No complications reported from the B12 supplementation.    PAST MEDICAL HISTORY : Reviewed.  No changes.  [facility] MEDICATIONS: [facility]  REVIEW OF SYSTEMS:  Unobtainable due to dementia.    PHYSICAL EXAMINATION  VS: See vital signs section  GENERAL: no acute distress, normal body habitus NECK: supple, trachea midline, no neck masses, no thyroid tenderness, no thyromegaly RESPIRATORY: breathing is even & unlabored, BS CTAB CARDIAC: RRR, no murmur,no extra heart sounds, no edema GI: abdomen soft, normal BS, no masses, no tenderness, no hepatomegaly, no splenomegaly PSYCHIATRIC: the patient is alert, disoriented, affect & behavior appropriate  LABS/RADIOLOGY: 6-15 Depakote level 45 4-15 hemoglobin A1c 6 2-15 hemoglobin 11, MCV 89 otherwise CBC normal, CMP normal 12-14 vitamin D level 17.8, vitamin B12 level 379, folate level 12, Depakote level 24 8/14 cbc & cmp nl, HbA1c 6.3 5/14 Depakote level 31 07/2012:  Platelets 144, otherwise CBC normal.    CMP normal.   Hemoglobin A1C 5.8.     04/2012:  Depakote level 22.    ASSESSMENT/PLAN:       Alzheimer's dementia.  Advanced. Depakote was increased.  Vitamin B12 deficiency 266.2.  Continue  supplementation.    Constipation.  No problems reported by the staff.    Vitamin D deficiency-on vitamin D  Anemia-stable  CPT CODE: [id number]  Newton PiggGayani Y. Kerry Doryasanayaka, MD [facility] [phone]

## 2013-12-18 ENCOUNTER — Non-Acute Institutional Stay (SKILLED_NURSING_FACILITY): Payer: Medicare Other | Admitting: Internal Medicine

## 2013-12-18 DIAGNOSIS — E559 Vitamin D deficiency, unspecified: Secondary | ICD-10-CM

## 2013-12-18 DIAGNOSIS — F0280 Dementia in other diseases classified elsewhere without behavioral disturbance: Secondary | ICD-10-CM

## 2013-12-18 DIAGNOSIS — G309 Alzheimer's disease, unspecified: Principal | ICD-10-CM

## 2013-12-18 DIAGNOSIS — K5900 Constipation, unspecified: Secondary | ICD-10-CM

## 2013-12-18 DIAGNOSIS — E538 Deficiency of other specified B group vitamins: Secondary | ICD-10-CM

## 2013-12-19 NOTE — Progress Notes (Signed)
Patient ID: Felicia CorriganBetty Wente, female   DOB: 07-11-1932, 80 y.o.   MRN: [id number]         PROGRESS NOTE  DATE: 12/18/2013  [facility]  LEVEL OF CARE: SNF (31)  Routine Visit  CHIEF COMPLAINT:  Manage dementia, and vitamin B12 deficiency.    HISTORY OF PRESENT ILLNESS:   REASSESSMENT OF ONGOING PROBLEM(S):  DEMENTIA: The dementia remains stable and continues to function adequately in the current living environment with supervision.  The patient has had little changes in behavior. No complications noted from the medications presently being used. Patient is a poor historian.    VITAMIN B12 DEFICIENCY: The B12 deficiency remains stable.  Staff deny paresthesias or gait & balance problems.  No complications reported from the B12 supplementation.    PAST MEDICAL HISTORY : Reviewed.  No changes.  [facility]  REVIEW OF SYSTEMS:  Unobtainable due to dementia.    PHYSICAL EXAMINATION  VS: See vital signs section  GENERAL: no acute distress, normal body habitus NECK: supple, trachea midline, no neck masses, no thyroid tenderness, no thyromegaly RESPIRATORY: breathing is even & unlabored, BS CTAB CARDIAC: RRR, no murmur,no extra heart sounds, no edema GI: abdomen soft, normal BS, no masses, no tenderness, no hepatomegaly, no splenomegaly PSYCHIATRIC: the patient is alert, disoriented, affect & behavior appropriate  LABS/RADIOLOGY: 6-15 Depakote level 45 4-15 hemoglobin A1c 6 2-15 hemoglobin 11, MCV 89 otherwise CBC normal, CMP normal 12-14 vitamin D level 17.8, vitamin B12 level 379, folate level 12, Depakote level 24 8/14 cbc & cmp nl, HbA1c 6.3 5/14 Depakote level 31 07/2012:  Platelets 144, otherwise CBC normal.    CMP normal.   Hemoglobin A1C 5.8.     04/2012:  Depakote level 22.    ASSESSMENT/PLAN:       Alzheimer's dementia.  Advanced. Depakote was increased.  Vitamin B12 deficiency 266.2.  Continue  supplementation.    Constipation.  No problems reported by the staff.    Vitamin D deficiency-on vitamin D  Anemia-stable  CPT CODE: [id number]  Newton PiggGayani Y. Kerry Doryasanayaka, MD [facility] [phone]

## 2014-01-13 ENCOUNTER — Non-Acute Institutional Stay (SKILLED_NURSING_FACILITY): Payer: Medicare Other | Admitting: Internal Medicine

## 2014-01-13 DIAGNOSIS — K5900 Constipation, unspecified: Secondary | ICD-10-CM

## 2014-01-13 DIAGNOSIS — G309 Alzheimer's disease, unspecified: Principal | ICD-10-CM

## 2014-01-13 DIAGNOSIS — E538 Deficiency of other specified B group vitamins: Secondary | ICD-10-CM

## 2014-01-13 DIAGNOSIS — F0280 Dementia in other diseases classified elsewhere without behavioral disturbance: Secondary | ICD-10-CM

## 2014-01-13 DIAGNOSIS — E559 Vitamin D deficiency, unspecified: Secondary | ICD-10-CM

## 2014-01-14 NOTE — Progress Notes (Signed)
Patient ID: Felicia CorriganBetty Mcdonald, female   DOB: March 01, 1933, 80 y.o.   MRN: [id number]         PROGRESS NOTE  DATE: 01/13/2014  [facility]  LEVEL OF CARE: SNF (31)  Routine Visit  CHIEF COMPLAINT:  Manage dementia, and vitamin B12 deficiency.    HISTORY OF PRESENT ILLNESS:   REASSESSMENT OF ONGOING PROBLEM(S):  DEMENTIA: The dementia remains stable and continues to function adequately in the current living environment with supervision.  The patient has had little changes in behavior. No complications noted from the medications presently being used. Patient is a poor historian.    VITAMIN B12 DEFICIENCY: The B12 deficiency remains stable.  Staff deny paresthesias or gait & balance problems.  No complications reported from the B12 supplementation.    PAST MEDICAL HISTORY : Reviewed.  No changes.  CURRENT MEDICATIONS: [facility]  REVIEW OF SYSTEMS:  Unobtainable due to dementia.    PHYSICAL EXAMINATION  VS: See vital signs section  GENERAL: no acute distress, normal body habitus NECK: supple, trachea midline, no neck masses, no thyroid tenderness, no thyromegaly RESPIRATORY: breathing is even & unlabored, BS CTAB CARDIAC: RRR, no murmur,no extra heart sounds, no edema GI: abdomen soft, normal BS, no masses, no tenderness, no hepatomegaly, no splenomegaly PSYCHIATRIC: the patient is alert, disoriented, affect & behavior appropriate  LABS/RADIOLOGY: 6-15 Depakote level 45 4-15 hemoglobin A1c 6 2-15 hemoglobin 11, MCV 89 otherwise CBC normal, CMP normal 12-14 vitamin D level 17.8, vitamin B12 level 379, folate level 12, Depakote level 24 8/14 cbc & cmp nl, HbA1c 6.3 5/14 Depakote level 31 07/2012:  Platelets 144, otherwise CBC normal.    CMP normal.   Hemoglobin A1C 5.8.     04/2012:  Depakote level 22.    ASSESSMENT/PLAN:       Alzheimer's dementia.  Advanced.   Vitamin B12 deficiency 266.2.  Continue supplementation.    Constipation.   No problems reported by the staff.    Vitamin D deficiency-on vitamin D  Anemia-stable. Recheck.  Check CBC and CMP  CPT CODE: [id number]  Newton PiggGayani Y. Kerry Doryasanayaka, MD [facility] [phone]

## 2014-01-27 ENCOUNTER — Non-Acute Institutional Stay (SKILLED_NURSING_FACILITY): Payer: Medicare Other | Admitting: Internal Medicine

## 2014-01-27 DIAGNOSIS — D638 Anemia in other chronic diseases classified elsewhere: Secondary | ICD-10-CM

## 2014-01-27 NOTE — Progress Notes (Signed)
         PROGRESS NOTE  DATE: 01/27/2014  FACILITY:  [facility]  LEVEL OF CARE: SNF (31)  Acute Visit  CHIEF COMPLAINT:  Manage anemia  HISTORY OF PRESENT ILLNESS: I was requested by the staff to assess the patient regarding above problem(s):  ANEMIA: The anemia has been stable. The staff deny fatigue, melena or hematochezia. No complications from the medications currently being used. On 01-14-14 hemoglobin 11, MCV 88. Patient is a poor historian due to dementia.  PAST MEDICAL HISTORY : Reviewed.  No changes/see problem list  CURRENT MEDICATIONS: Reviewed per MAR/see medication list  PHYSICAL EXAMINATION  VS: see VS section  GENERAL: no acute distress, normal body habitus RESPIRATORY: breathing is even & unlabored, BS CTAB CARDIAC: RRR, no murmur,no extra heart sounds, no edema  LABS/RADIOLOGY: See history of present illness  ASSESSMENT/PLAN:  Anemia-stable. We'll monitor.  CPT CODE: 16109  Angela Cox, MD [facility] [phone]

## 2014-02-24 ENCOUNTER — Non-Acute Institutional Stay (SKILLED_NURSING_FACILITY): Payer: Medicare Other | Admitting: Internal Medicine

## 2014-02-24 DIAGNOSIS — F0280 Dementia in other diseases classified elsewhere without behavioral disturbance: Secondary | ICD-10-CM

## 2014-02-24 DIAGNOSIS — E559 Vitamin D deficiency, unspecified: Secondary | ICD-10-CM

## 2014-02-24 DIAGNOSIS — E538 Deficiency of other specified B group vitamins: Secondary | ICD-10-CM

## 2014-02-24 DIAGNOSIS — G309 Alzheimer's disease, unspecified: Principal | ICD-10-CM

## 2014-02-24 DIAGNOSIS — K5900 Constipation, unspecified: Secondary | ICD-10-CM

## 2014-02-25 NOTE — Progress Notes (Signed)
Patient ID: Felicia Mcdonald, female   DOB: [birth year], 80 y.o.   MRN: [id number]         PROGRESS NOTE  DATE: 02/24/2014  FACILITY[facility]  [facility]  LEVEL OF CARE: SNF (31)  Routine Visit  CHIEF COMPLAINT:  Manage dementia, and vitamin B12 deficiency.    HISTORY OF PRESENT ILLNESS:   REASSESSMENT OF ONGOING PROBLEM(S):  DEMENTIA: The dementia remains stable and continues to function adequately in the current living environment with supervision.  The patient has had little changes in behavior. No complications noted from the medications presently being used. Patient is a poor historian.    VITAMIN B12 DEFICIENCY: The B12 deficiency remains stable.  Staff deny paresthesias or gait & balance problems.  No complications reported from the B12 supplementation.    PAST MEDICAL HISTORY : Reviewed.  No changes.  CURRENT MEDICATIONS: Reviewed per [facility]  REVIEW OF SYSTEMS:  Unobtainable due to dementia.    PHYSICAL EXAMINATION  VS: See vital signs section  GENERAL: no acute distress, normal body habitus NECK: supple, trachea midline, no neck masses, no thyroid tenderness, no thyromegaly RESPIRATORY: breathing is even & unlabored, BS CTAB CARDIAC: RRR, no murmur,no extra heart sounds, no edema GI: abdomen soft, normal BS, no masses, no tenderness, no hepatomegaly, no splenomegaly PSYCHIATRIC: the patient is alert, disoriented, affect & behavior appropriate  LABS/RADIOLOGY: 8-15 CMP normal, hemoglobin 11, MCV 88 otherwise CBC normal 6-15 Depakote level 45 4-15 hemoglobin A1c 6 2-15 hemoglobin 11, MCV 89 otherwise CBC normal, CMP normal 12-14 vitamin D level 17.8, vitamin B12 level 379, folate level 12, Depakote level 24 8/14 cbc & cmp nl, HbA1c 6.3 5/14 Depakote level 31 07/2012:  Platelets 144, otherwise CBC normal.    CMP normal.   Hemoglobin A1C 5.8.     04/2012:  Depakote level 22.    ASSESSMENT/PLAN:       Alzheimer's dementia.  Advanced.   Vitamin B12  deficiency 266.2.  Continue supplementation.    Constipation.  No problems reported by the staff.    Vitamin D deficiency-on vitamin D  Anemia-stable.   CPT CODE: 40981  Newton Pigg. Kerry Dory, MD [facility] [phone]

## 2014-03-19 ENCOUNTER — Non-Acute Institutional Stay (SKILLED_NURSING_FACILITY): Payer: Medicare Other | Admitting: Internal Medicine

## 2014-03-19 DIAGNOSIS — E538 Deficiency of other specified B group vitamins: Secondary | ICD-10-CM

## 2014-03-19 DIAGNOSIS — E559 Vitamin D deficiency, unspecified: Secondary | ICD-10-CM

## 2014-03-19 DIAGNOSIS — F0280 Dementia in other diseases classified elsewhere without behavioral disturbance: Secondary | ICD-10-CM

## 2014-03-19 DIAGNOSIS — K5900 Constipation, unspecified: Secondary | ICD-10-CM

## 2014-03-19 DIAGNOSIS — G309 Alzheimer's disease, unspecified: Secondary | ICD-10-CM

## 2014-03-20 DIAGNOSIS — E538 Deficiency of other specified B group vitamins: Secondary | ICD-10-CM | POA: Insufficient documentation

## 2014-03-20 NOTE — Progress Notes (Signed)
Patient ID: Felicia CorriganBetty Mcdonald, female   DOB: 05/15/1933, 80 y.o.   MRN: [id number]         PROGRESS NOTE  DATE: 03/19/2014  [facility]  LEVEL OF CARE: SNF (31)  Routine Visit  CHIEF COMPLAINT:  Manage dementia, and vitamin B12 deficiency.    HISTORY OF PRESENT ILLNESS:   REASSESSMENT OF ONGOING PROBLEM(S):  DEMENTIA: The dementia remains stable and continues to function adequately in the current living environment with supervision.  The patient has had little changes in behavior. No complications noted from the medications presently being used. Patient is a poor historian.    VITAMIN B12 DEFICIENCY: The B12 deficiency remains stable.  Staff deny paresthesias or gait & balance problems.  No complications reported from the B12 supplementation.    PAST MEDICAL HISTORY : Reviewed.  No changes.  [facility]  REVIEW OF SYSTEMS:  Unobtainable due to dementia.    PHYSICAL EXAMINATION  VS: See vital signs section  GENERAL: no acute distress, normal body habitus NECK: supple, trachea midline, no neck masses, no thyroid tenderness, no thyromegaly RESPIRATORY: breathing is even & unlabored, BS CTAB CARDIAC: RRR, no murmur,no extra heart sounds, no edema GI: abdomen soft, normal BS, no masses, no tenderness, no hepatomegaly, no splenomegaly PSYCHIATRIC: the patient is alert, disoriented, affect & behavior appropriate  LABS/RADIOLOGY: 8-15 CMP normal, hemoglobin 11, MCV 88 otherwise CBC normal 6-15 Depakote level 45 4-15 hemoglobin A1c 6 2-15 hemoglobin 11, MCV 89 otherwise CBC normal, CMP normal 12-14 vitamin D level 17.8, vitamin B12 level 379, folate level 12, Depakote level 24 8/14 cbc & cmp nl, HbA1c 6.3 5/14 Depakote level 31 07/2012:  Platelets 144, otherwise CBC normal.    CMP normal.   Hemoglobin A1C 5.8.     04/2012:  Depakote level 22.    ASSESSMENT/PLAN:       Alzheimer's dementia.  Advanced.   Vitamin B12  deficiency 266.2.  Continue supplementation.    Constipation.  No problems reported by the staff.    Vitamin D deficiency-on vitamin D  Anemia-stable.   CPT CODE: [id number]  Newton PiggGayani Y. Kerry Doryasanayaka, MD [facility] [phone]

## 2015-12-25 ENCOUNTER — Encounter (HOSPITAL_COMMUNITY): Payer: Self-pay | Admitting: Emergency Medicine

## 2015-12-25 ENCOUNTER — Emergency Department (HOSPITAL_COMMUNITY)
Admission: EM | Admit: 2015-12-25 | Discharge: 2015-12-26 | Disposition: A | Discharge status: Home / Self Care | Payer: Medicare Other | Attending: Emergency Medicine | Admitting: Emergency Medicine

## 2015-12-25 ENCOUNTER — Emergency Department (HOSPITAL_COMMUNITY): Payer: Medicare Other

## 2015-12-25 DIAGNOSIS — W1839XA Other fall on same level, initial encounter: Secondary | ICD-10-CM | POA: Diagnosis not present

## 2015-12-25 DIAGNOSIS — Y939 Activity, unspecified: Secondary | ICD-10-CM | POA: Insufficient documentation

## 2015-12-25 DIAGNOSIS — Y999 Unspecified external cause status: Secondary | ICD-10-CM | POA: Diagnosis not present

## 2015-12-25 DIAGNOSIS — Z79899 Other long term (current) drug therapy: Secondary | ICD-10-CM | POA: Insufficient documentation

## 2015-12-25 DIAGNOSIS — G309 Alzheimer's disease, unspecified: Secondary | ICD-10-CM | POA: Insufficient documentation

## 2015-12-25 DIAGNOSIS — Y929 Unspecified place or not applicable: Secondary | ICD-10-CM | POA: Insufficient documentation

## 2015-12-25 DIAGNOSIS — S0990XA Unspecified injury of head, initial encounter: Secondary | ICD-10-CM | POA: Diagnosis present

## 2015-12-25 DIAGNOSIS — W19XXXA Unspecified fall, initial encounter: Secondary | ICD-10-CM

## 2015-12-25 DIAGNOSIS — T148XXA Other injury of unspecified body region, initial encounter: Secondary | ICD-10-CM

## 2015-12-25 DIAGNOSIS — S0091XA Abrasion of unspecified part of head, initial encounter: Secondary | ICD-10-CM | POA: Diagnosis not present

## 2015-12-25 NOTE — ED Notes (Signed)
Bed: [id number] Expected date:  Expected time:  Means of arrival:  Comments: Fall, head laceration

## 2015-12-25 NOTE — ED Notes (Signed)
[facility]  Witnessed fall in shower.  Pt struck head, some bleeding and swelling to posterior of head.  No LOC.  Pt is at her neuro baseline per staff at facility.  Non verbal normally.  Pt moves extremities.  Pt is a DNR.

## 2015-12-26 NOTE — ED Provider Notes (Signed)
CSN: [id number]     Arrival date & time 12/25/15  2124 History   First MD Initiated Contact with Patient 12/25/15 2156     Chief Complaint  Patient presents with  . Fall     (Consider location/radiation/quality/duration/timing/severity/associated sxs/prior Treatment) HPI   Level V Caveat Dementia 82yo female presenting from [facility] after witnessed fall while in the shower.  Patient with some bleeding and swelling.  Did not lose consciousness.  Facility reports she is nonverbal and at her baseline. No other concerns  Patient is alert, does not answer questions  Hx from facility via EMS  Past Medical History  Diagnosis Date  . Unspecified constipation 11/07/2012  . Alzheimer's disease 11/07/2012  . Other B-complex deficiencies 11/07/2012   History reviewed. No pertinent past surgical history. History reviewed. No pertinent family history. Social History  Substance Use Topics  . Smoking status: Never Smoker   . Smokeless tobacco: None  . Alcohol Use: None   OB History    No data available     Review of Systems  Unable to perform ROS: Dementia  Skin: Positive for wound.      Allergies  Review of patient's allergies indicates no known allergies.  Home Medications   Prior to Admission medications   Medication Sig Start Date End Date Taking? Authorizing Provider  cholecalciferol (VITAMIN D) 1000 units tablet Take 2,000 Units by mouth daily.   Yes Historical Provider, MD  Cyanocobalamin (VITAMIN B-12 IJ) Inject 1 mL as directed every 30 (thirty) days.   Yes Historical Provider, MD  divalproex (DEPAKOTE SPRINKLE) 125 MG capsule Take 250 mg by mouth at bedtime. 12/06/15  Yes Historical Provider, MD   BP 144/79 mmHg  Pulse 70  Temp(Src) 97.7 F (36.5 C) (Oral)  Resp 17  SpO2 97% Physical Exam  Constitutional: She appears well-developed and well-nourished. No distress.  HENT:  Head: Normocephalic. Head is with abrasion (to occiput, no sign of laceration, no active  bleeding, underlying hematoma).  Eyes: Conjunctivae and EOM are normal.  Neck: Normal range of motion.  Cardiovascular: Normal rate, regular rhythm, normal heart sounds and intact distal pulses.  Exam reveals no gallop and no friction rub.   No murmur heard. Pulmonary/Chest: Effort normal and breath sounds normal. No respiratory distress. She has no wheezes. She has no rales.  Abdominal: Soft. She exhibits no distension. There is no tenderness. There is no guarding.  Musculoskeletal: She exhibits no edema or tenderness.       Right hip: She exhibits normal range of motion.       Left hip: She exhibits normal range of motion.  Neurological: She is alert. GCS eye subscore is 4. GCS verbal subscore is 2.  Nonverbal, occasional incomprehensible speech.  Moving all 4 extremities spontaneously and appears to have normal sensation.  Intermittently follows commands  Skin: Skin is warm and dry. No rash noted. She is not diaphoretic. No erythema.  Nursing note and vitals reviewed.   ED Course  Procedures (including critical care time) Labs Review Labs Reviewed - No data to display  Imaging Review Ct Head Wo Contrast  12/25/2015  CLINICAL DATA:  Fall.  Alzheimer's disease.  Initial encounter. EXAM: CT HEAD WITHOUT CONTRAST CT CERVICAL SPINE WITHOUT CONTRAST TECHNIQUE: Multidetector CT imaging of the head and cervical spine was performed following the standard protocol without intravenous contrast. Multiplanar CT image reconstructions of the cervical spine were also generated. COMPARISON:  None. FINDINGS: CT HEAD FINDINGS Brain: No evidence of acute infarction, hemorrhage,  obstructive hydrocephalus, or mass lesion/mass effect. Advanced generalized atrophy with ventriculomegaly. Prominent mesial temporal volume loss correlating with Alzheimer's disease history. Chronic small vessel disease with confluent ischemic gliosis, greater in the frontal regions. Vascular: Atherosclerotic calcification. Skull:  Posterior scalp swelling without calvarial fracture. Sinuses/Orbits: No acute finding.  Right cataract resection. Other: None. CT CERVICAL SPINE FINDINGS Alignment: No traumatic malalignment. Mild C4-5 anterolisthesis which is considered degenerative. Skull base and vertebrae: No acute fracture. C5-6 ACDF with solid fusion. Soft tissues and canal: No prevertebral fluid. No gross canal hematoma. Upper chest: No acute finding IMPRESSION: 1. No evidence of acute intracranial or cervical spine injury. 2. Scalp swelling without calvarial fracture. 3. Advanced atrophy correlating with dementia history. Advanced chronic microvascular disease. Electronically Signed   By: Marnee Spring M.D.   On: 12/25/2015 23:35   Ct Cervical Spine Wo Contrast  12/25/2015  CLINICAL DATA:  Fall.  Alzheimer's disease.  Initial encounter. EXAM: CT HEAD WITHOUT CONTRAST CT CERVICAL SPINE WITHOUT CONTRAST TECHNIQUE: Multidetector CT imaging of the head and cervical spine was performed following the standard protocol without intravenous contrast. Multiplanar CT image reconstructions of the cervical spine were also generated. COMPARISON:  None. FINDINGS: CT HEAD FINDINGS Brain: No evidence of acute infarction, hemorrhage, obstructive hydrocephalus, or mass lesion/mass effect. Advanced generalized atrophy with ventriculomegaly. Prominent mesial temporal volume loss correlating with Alzheimer's disease history. Chronic small vessel disease with confluent ischemic gliosis, greater in the frontal regions. Vascular: Atherosclerotic calcification. Skull: Posterior scalp swelling without calvarial fracture. Sinuses/Orbits: No acute finding.  Right cataract resection. Other: None. CT CERVICAL SPINE FINDINGS Alignment: No traumatic malalignment. Mild C4-5 anterolisthesis which is considered degenerative. Skull base and vertebrae: No acute fracture. C5-6 ACDF with solid fusion. Soft tissues and canal: No prevertebral fluid. No gross canal hematoma.  Upper chest: No acute finding IMPRESSION: 1. No evidence of acute intracranial or cervical spine injury. 2. Scalp swelling without calvarial fracture. 3. Advanced atrophy correlating with dementia history. Advanced chronic microvascular disease. Electronically Signed   By: Marnee Spring M.D.   On: 12/25/2015 23:35   I have personally reviewed and evaluated these images and lab results as part of my medical decision-making.   EKG Interpretation None      MDM   Final diagnoses:  Fall, initial encounter  Abrasion    82yo female with history of dementia presents for witnessed mechanical fall in the facility while taking a shower. No other acute changes. CT head and cervical spine without acute pathology.  Patient without signs of other extremity, spinal, abdominal or chest wall tenderness. She is unable to participate in history per baseline, however is alert, moving all 4 extremities, and hemodynamically stable and doubt other acute pathology. Abrasion to back of head and recommend wound care.  Patient discharged in stable condition with understanding of reasons to return.    Alvira Monday, MD 12/26/15 1226

## 2015-12-26 NOTE — Discharge Instructions (Signed)

## 2015-12-26 NOTE — ED Notes (Signed)
Report received from [facility]

## 2016-05-15 ENCOUNTER — Inpatient Hospital Stay (HOSPITAL_COMMUNITY)
Admission: EM | Admit: 2016-05-15 | Discharge: 2016-05-18 | DRG: 683 | Disposition: A | Discharge status: Skilled Nursing Facility | Payer: Medicare Other | Attending: Internal Medicine | Admitting: Internal Medicine

## 2016-05-15 ENCOUNTER — Emergency Department (HOSPITAL_COMMUNITY): Payer: Medicare Other

## 2016-05-15 DIAGNOSIS — G309 Alzheimer's disease, unspecified: Secondary | ICD-10-CM | POA: Diagnosis present

## 2016-05-15 DIAGNOSIS — E860 Dehydration: Secondary | ICD-10-CM | POA: Diagnosis not present

## 2016-05-15 DIAGNOSIS — E870 Hyperosmolality and hypernatremia: Secondary | ICD-10-CM | POA: Diagnosis present

## 2016-05-15 DIAGNOSIS — N390 Urinary tract infection, site not specified: Secondary | ICD-10-CM | POA: Diagnosis present

## 2016-05-15 DIAGNOSIS — R627 Adult failure to thrive: Secondary | ICD-10-CM | POA: Diagnosis present

## 2016-05-15 DIAGNOSIS — N179 Acute kidney failure, unspecified: Principal | ICD-10-CM | POA: Diagnosis present

## 2016-05-15 DIAGNOSIS — Z66 Do not resuscitate: Secondary | ICD-10-CM | POA: Diagnosis present

## 2016-05-15 DIAGNOSIS — B9620 Unspecified Escherichia coli [E. coli] as the cause of diseases classified elsewhere: Secondary | ICD-10-CM | POA: Diagnosis present

## 2016-05-15 DIAGNOSIS — I4891 Unspecified atrial fibrillation: Secondary | ICD-10-CM | POA: Diagnosis present

## 2016-05-15 DIAGNOSIS — R2689 Other abnormalities of gait and mobility: Secondary | ICD-10-CM

## 2016-05-15 DIAGNOSIS — F0280 Dementia in other diseases classified elsewhere without behavioral disturbance: Secondary | ICD-10-CM | POA: Diagnosis present

## 2016-05-15 DIAGNOSIS — I700 Atherosclerosis of aorta: Secondary | ICD-10-CM | POA: Diagnosis present

## 2016-05-15 DIAGNOSIS — R4182 Altered mental status, unspecified: Secondary | ICD-10-CM | POA: Diagnosis present

## 2016-05-15 LAB — CBC WITH DIFFERENTIAL/PLATELET
BASOS ABS: 0 10*3/uL (ref 0.0–0.1)
Basophils Relative: 0 %
EOS ABS: 0 10*3/uL (ref 0.0–0.7)
EOS PCT: 0 %
HCT: 40 % (ref 36.0–46.0)
Hemoglobin: 13 g/dL (ref 12.0–15.0)
LYMPHS PCT: 18 %
Lymphs Abs: 1.6 10*3/uL (ref 0.7–4.0)
MCH: 29.5 pg (ref 26.0–34.0)
MCHC: 32.5 g/dL (ref 30.0–36.0)
MCV: 90.9 fL (ref 78.0–100.0)
Monocytes Absolute: 0.6 10*3/uL (ref 0.1–1.0)
Monocytes Relative: 6 %
NEUTROS PCT: 76 %
Neutro Abs: 6.7 10*3/uL (ref 1.7–7.7)
PLATELETS: 219 10*3/uL (ref 150–400)
RBC: 4.4 MIL/uL (ref 3.87–5.11)
RDW: 13.9 % (ref 11.5–15.5)
WBC: 8.8 10*3/uL (ref 4.0–10.5)

## 2016-05-15 LAB — URINALYSIS, ROUTINE W REFLEX MICROSCOPIC
Bilirubin Urine: NEGATIVE
Glucose, UA: NEGATIVE mg/dL
Ketones, ur: NEGATIVE mg/dL
Leukocytes, UA: NEGATIVE
Nitrite: NEGATIVE
PROTEIN: NEGATIVE mg/dL
Specific Gravity, Urine: 1.019 (ref 1.005–1.030)
pH: 5 (ref 5.0–8.0)

## 2016-05-15 LAB — BASIC METABOLIC PANEL
Anion gap: 5 (ref 5–15)
BUN: 41 mg/dL — ABNORMAL HIGH (ref 6–20)
CALCIUM: 8.9 mg/dL (ref 8.9–10.3)
CO2: 28 mmol/L (ref 22–32)
CREATININE: 1.36 mg/dL — AB (ref 0.44–1.00)
Chloride: 119 mmol/L — ABNORMAL HIGH (ref 101–111)
GFR calc non Af Amer: 35 mL/min — ABNORMAL LOW (ref >60)
GFR, EST AFRICAN AMERICAN: 41 mL/min — AB (ref >60)
Glucose, Bld: 107 mg/dL — ABNORMAL HIGH (ref 65–99)
Potassium: 4.3 mmol/L (ref 3.5–5.1)
Sodium: 152 mmol/L — ABNORMAL HIGH (ref 135–145)

## 2016-05-15 LAB — COMPREHENSIVE METABOLIC PANEL
ALT: 24 U/L (ref 14–54)
AST: 57 U/L — AB (ref 15–41)
Albumin: 3.7 g/dL (ref 3.5–5.0)
Alkaline Phosphatase: 53 U/L (ref 38–126)
Anion gap: 11 (ref 5–15)
BUN: 47 mg/dL — AB (ref 6–20)
CHLORIDE: 116 mmol/L — AB (ref 101–111)
CO2: 25 mmol/L (ref 22–32)
CREATININE: 1.57 mg/dL — AB (ref 0.44–1.00)
Calcium: 9.8 mg/dL (ref 8.9–10.3)
GFR calc Af Amer: 35 mL/min — ABNORMAL LOW (ref >60)
GFR calc non Af Amer: 30 mL/min — ABNORMAL LOW (ref >60)
GLUCOSE: 113 mg/dL — AB (ref 65–99)
Potassium: 4.5 mmol/L (ref 3.5–5.1)
SODIUM: 152 mmol/L — AB (ref 135–145)
Total Bilirubin: 1 mg/dL (ref 0.3–1.2)
Total Protein: 8 g/dL (ref 6.5–8.1)

## 2016-05-15 LAB — TROPONIN I

## 2016-05-15 LAB — VALPROIC ACID LEVEL: Valproic Acid Lvl: <10 ug/mL — ABNORMAL LOW (ref 50.0–100.0)

## 2016-05-15 LAB — MRSA PCR SCREENING: MRSA BY PCR: NEGATIVE

## 2016-05-15 MED ORDER — SODIUM CHLORIDE 0.45 % IV SOLN
INTRAVENOUS | Status: DC
  Administered 2016-05-15 – 2016-05-16 (×2): via INTRAVENOUS

## 2016-05-15 MED ORDER — SODIUM CHLORIDE 0.9 % IV BOLUS (SEPSIS)
1000.0000 mL | Freq: Once | INTRAVENOUS | Status: AC
  Administered 2016-05-15: 1000 mL via INTRAVENOUS

## 2016-05-15 MED ORDER — SENNOSIDES-DOCUSATE SODIUM 8.6-50 MG PO TABS
1.0000 | ORAL_TABLET | Freq: Every day | ORAL | Status: DC
  Administered 2016-05-16 – 2016-05-17 (×2): 1 via ORAL
  Filled 2016-05-15 (×4): qty 1

## 2016-05-15 MED ORDER — VITAMIN D 1000 UNITS PO TABS
2000.0000 [IU] | ORAL_TABLET | Freq: Every day | ORAL | Status: DC
  Administered 2016-05-18: 2000 [IU] via ORAL
  Filled 2016-05-15: qty 2

## 2016-05-15 MED ORDER — HEPARIN SODIUM (PORCINE) 5000 UNIT/ML IJ SOLN
5000.0000 [IU] | Freq: Three times a day (TID) | INTRAMUSCULAR | Status: DC
  Administered 2016-05-15 – 2016-05-18 (×9): 5000 [IU] via SUBCUTANEOUS
  Filled 2016-05-15 (×9): qty 1

## 2016-05-15 MED ORDER — SODIUM CHLORIDE 0.9 % IV SOLN
INTRAVENOUS | Status: DC
  Administered 2016-05-15: 13:00:00 via INTRAVENOUS

## 2016-05-15 NOTE — ED Notes (Signed)
Attempted reported x 2. Has been 40 mins. Secretary said charge nurse would call me back.

## 2016-05-15 NOTE — Progress Notes (Signed)
Dr. Mikey BussingHoffman notified of arrival to [phone]

## 2016-05-15 NOTE — H&P (Signed)
Date: 05/15/2016               Felicia Mcdonald MRN: [id number]  DOB: 18-Dec-1932 Age / Sex: 82 y.o., female   PCP: No primary care provider on file.         Medical Service: Internal Medicine Teaching Service         Attending Physician: Dr. Earl LagosNischal Narendra, MD    First Contact: Dr. Mikey BussingHoffman Pager: [phone]  Second Contact: Dr. Karma GreaserBoswell Pager: [phone]       After Hours (After 5p/  First Contact Pager: [phone]  weekends / holidays): Second Contact Pager: [phone]   Chief Complaint: Altered mental status  History of Present Illness: Felicia Mcdonald was an 82 year old woman with history of Alzheimer disease that presents [facility] the [facility] by [facility] for altered mental status.  Felicia is only responsive to painful stimuli and history was obtained from EMR. Per notes Felicia has not been as active as she normally is for the past 24 hours.  There has not been any reported fevers, vomiting, diarrhea, trauma, cough or congestion.  No treatment was given by EMS prior to arrival.  Meds:  Current Meds  Medication Sig  . cholecalciferol (VITAMIN D) 1000 units tablet Take 2,000 Units by mouth daily.  . Cyanocobalamin (VITAMIN B-12 IJ) Inject 1 mL as directed every 30 (thirty) days.  . sennosides-docusate sodium (SENOKOT-S) 8.6-50 MG tablet Take 1 tablet by mouth at bedtime.     Allergies: Allergies as of 05/15/2016  . (No Known Allergies)   Past Medical History:  Diagnosis Date  . Alzheimer's disease 11/07/2012  . Other B-complex deficiencies 11/07/2012  . Unspecified constipation 11/07/2012    Family History: Felicia was only responsive to painful Stimuli and could not answer questions pertaining to the history of present illness.  Social History: Felicia was only responsive to painful Stimuli and could not answer questions pertaining to the history of present illness.  Review of Systems: Felicia was only responsive to painful Stimuli and could not  answer questions pertaining to the history of present illness.  Physical Exam: Blood pressure 108/77, pulse 75, temperature 98.3 F (36.8 C), temperature source Rectal, resp. rate 17, SpO2 97 %. Vitals:   05/15/16 1515 05/15/16 1530 05/15/16 1545 05/15/16 1630  BP: 128/61 142/66 108/77 132/75  Pulse: 74 81 75 75  Resp: 17 17 17 15   Temp:      TempSrc:      SpO2: 98% 97% 97% 96%   General: Vital signs reviewed.  Felicia is in no acute distress and responsive only to painful stimuli.  Head: Normocephalic and atraumatic. Eyes: pupils are equal, round and reactive to light  Neck: Supple, trachea midline, normal ROM, no JVD, masses Cardiovascular: RRR, S1 normal, S2 normal, no murmurs, gallops, or rubs. Pulmonary/Chest: Difficult to assess because Felicia was snoring Abdominal: Soft, non-tender, non-distended, BS +, no masses, organomegaly, or guarding present.  Musculoskeletal: No joint deformities, erythema, or stiffness, ROM full and nontender. Extremities: No lower extremity edema bilaterally,  pulses symmetric and intact bilaterally. No cyanosis or clubbing. Neurological: A&O x3, Strength is normal and symmetric bilaterally, cranial nerve II-XII are grossly intact, no focal motor deficit, sensory intact to light touch bilaterally.  Skin: Warm, dry and intact. No rashes or erythema. Psychiatric: Normal mood and affect. speech and behavior is normal. Cognition and memory are normal.   EKG: Pacemaker spikes noted  CXR:  FINDINGS: Examination  is degraded due to positioning in the Felicia's overlying right upper extremity.  Normal cardiac silhouette and mediastinal contours with atherosclerotic plaque within the thoracic aorta. Minimal bibasilar opacities, left greater than right, favored to represent atelectasis. No pleural effusion or pneumothorax. No evidence of edema. No acute osseus abnormalities.  IMPRESSION: 1. Degraded examination without acute cardiopulmonary  disease. Further evaluation with a PA and lateral chest radiograph may be obtained as clinically indicated. 2.  Aortic Atherosclerosis (ICD10-170.0)  CBC    Component Value Date/Time   WBC 8.8 05/15/2016 1241   RBC 4.40 05/15/2016 1241   HGB 13.0 05/15/2016 1241   HCT 40.0 05/15/2016 1241   PLT 219 05/15/2016 1241   MCV 90.9 05/15/2016 1241   MCH 29.5 05/15/2016 1241   MCHC 32.5 05/15/2016 1241   RDW 13.9 05/15/2016 1241   LYMPHSABS 1.6 05/15/2016 1241   MONOABS 0.6 05/15/2016 1241   EOSABS 0.0 05/15/2016 1241   BASOSABS 0.0 05/15/2016 1241   BMET    Component Value Date/Time   NA 152 (H) 05/15/2016 1241   K 4.5 05/15/2016 1241   CL 116 (H) 05/15/2016 1241   CO2 25 05/15/2016 1241   GLUCOSE 113 (H) 05/15/2016 1241   BUN 47 (H) 05/15/2016 1241   CREATININE 1.57 (H) 05/15/2016 1241   CALCIUM 9.8 05/15/2016 1241   GFRNONAA 30 (L) 05/15/2016 1241   GFRAA 35 (L) 05/15/2016 1241     Assessment & Plan by Problem: Active Problems: Altered mental status Spoke with Felicia's nurse [facility] and was told that Felicia was not her usual self since yesterday after breakfast.  She states that the Felicia's baseline is able to open eyes, has non-sensible speech and walks with assistance. She also needs assistance with feedings.  Yesterday she wouldn't eat and she would keep her eyes closed when talked to. On exam Felicia has dry mucous membranes and skin tenting.  Her sodium is 152 and creatinine is 1.57.  Her baseline sodium is144 and creatinine 0.88.  Felicia appears dehydrated.  She was given a 1L fluid bolus in the ED and we will continue with maintenance fluids.  On repeat BMET creatinine has improved to 1.36. - IV NS 125cc/hr - BMET in the morning   Hypernatremia Likely due to dehydration.  Labs on 04/08/16 showed Na to be 144.  Will give IV fluids overnight - IV NS 125cc/hr - BMET in morning   Alzheimer Baseline includes nonsensical  speech, opening eyes and response to name. -monitor for clinical improvement to baseline mental status after IV fluids  Failure to thrive Felicia's medications include vitamin D, senna and vitamin B12 monthly injections. - IV NS fluids - swallow evaluation  - PT/OT eval and treat  Diet: clear liquid Code status: DNR ~ document brought [facility] DVT prophylaxis: heparin   Dispo: Admit Felicia to Observation with expected length of stay less than 2 midnights.  Signed: Camelia PhenesJessica Ratliff Chole Driver, DO 05/15/2016, 4:35 PM  Pager: [phone]

## 2016-05-15 NOTE — ED Provider Notes (Signed)
[facility]-EMERGENCY DEPT Provider Note   CSN: [id number] Arrival date & time: 05/15/16  1213     History   Chief Complaint Chief Complaint  Patient presents with  . Altered Mental Status    HPI Felicia Mcdonald is a 82 y.o. female.  82 year old female with history of dementia presents from nursing home with altered mental status 24 hours. Per EMS, patient has not been as active as she normally is 1 day. No reported history of fever, vomiting, diarrhea. No recent history of trauma. No cough or congestion noted. Patient transported here for further evaluation. No treatment given prior to arrival.      Past Medical History:  Diagnosis Date  . Alzheimer's disease 11/07/2012  . Other B-complex deficiencies 11/07/2012  . Unspecified constipation 11/07/2012    Patient Active Problem List   Diagnosis Date Noted  . Vitamin B12 deficiency 03/20/2014  . Anemia of other chronic disease 07/31/2013  . Pure hypercholesterolemia 07/12/2013  . Vitamin D deficiency 06/14/2013  . Rash and other nonspecific skin eruption 04/12/2013  . Alzheimer's disease 11/07/2012  . Other B-complex deficiencies 11/07/2012  . Constipation 11/07/2012  . Encounter for long-term (current) use of other medications 11/07/2012  . Psychosis 09/27/2012    No past surgical history on file.  OB History    No data available       Home Medications    Prior to Admission medications   Medication Sig Start Date End Date Taking? Authorizing Provider  cholecalciferol (VITAMIN D) 1000 units tablet Take 2,000 Units by mouth daily.    Historical Provider, MD  Cyanocobalamin (VITAMIN B-12 IJ) Inject 1 mL as directed every 30 (thirty) days.    Historical Provider, MD  divalproex (DEPAKOTE SPRINKLE) 125 MG capsule Take 250 mg by mouth at bedtime. 12/06/15   Historical Provider, MD    Family History No family history on file.  Social History Social History  Substance Use Topics  . Smoking status: Never Smoker  . Smokeless  tobacco: Not on file  . Alcohol use Not on file     Allergies   Patient has no known allergies.   Review of Systems Review of Systems  Unable to perform ROS: Dementia     Physical Exam Updated Vital Signs There were no vitals taken for this visit.  Physical Exam  Constitutional: She appears lethargic. She appears cachectic.  Non-toxic appearance. She has a sickly appearance. She appears ill. No distress.  HENT:  Head: Normocephalic and atraumatic.  Eyes: Conjunctivae, EOM and lids are normal. Pupils are equal, round, and reactive to light.  Neck: Normal range of motion. Neck supple. No tracheal deviation present. No thyroid mass present.  Cardiovascular: Normal rate, regular rhythm and normal heart sounds.  Exam reveals no gallop.   No murmur heard. Pulmonary/Chest: Effort normal and breath sounds normal. No stridor. No respiratory distress. She has no decreased breath sounds. She has no wheezes. She has no rhonchi. She has no rales.  Abdominal: Soft. Normal appearance and bowel sounds are normal. She exhibits no distension. There is no tenderness. There is no rebound and no CVA tenderness.  Musculoskeletal: Normal range of motion. She exhibits no edema or tenderness.  Neurological: She appears lethargic. She is disoriented. She displays atrophy. No cranial nerve deficit or sensory deficit. GCS eye subscore is 3. GCS verbal subscore is 4. GCS motor subscore is 5.  Skin: Skin is warm and dry. No abrasion and no rash noted.  Psychiatric: She is inattentive.  Nursing note  and vitals reviewed.    ED Treatments / Results  Labs (all labs ordered are listed, but only abnormal results are displayed) Labs Reviewed  URINE CULTURE  CBC WITH DIFFERENTIAL/PLATELET  COMPREHENSIVE METABOLIC PANEL  URINALYSIS, ROUTINE W REFLEX MICROSCOPIC  TROPONIN I  VALPROIC ACID LEVEL    EKG  EKG Interpretation None       Radiology No results found.  Procedures Procedures (including  critical care time)  Medications Ordered in ED Medications  0.9 %  sodium chloride infusion (not administered)  sodium chloride 0.9 % bolus 1,000 mL (not administered)     Initial Impression / Assessment and Plan / ED Course  I have reviewed the triage vital signs and the nursing notes.  Pertinent labs & imaging results that were available during my care of the patient were reviewed by me and considered in my medical decision making (see chart for details).  Clinical Course     Patient given IV hydration here. Has evidence of hypernatremia as well as renal insufficiency. We'll admit to the medicine service  Final Clinical Impressions(s) / ED Diagnoses   Final diagnoses:  None    New Prescriptions New Prescriptions   No medications on file     Lorre NickAnthony Vilma Will, MD 05/15/16 1505

## 2016-05-15 NOTE — ED Triage Notes (Signed)
Per [facility] for altered mental status since yesterday morning.  Patient has history of dementia, baseline she is not alert but responds to stimulus and opens eyes, and speaks.  Currently only responsive to pain.  20g IV saline lock in right hand.

## 2016-05-15 NOTE — ED Notes (Signed)
Patient's daughter called for update and this was provided as she is pt's POA.  She states is in [place] at this time and unable to get to hospital quickly

## 2016-05-15 NOTE — ED Notes (Signed)
Contacted lab regarding delay in results.

## 2016-05-15 NOTE — ED Notes (Signed)
[facility] [place]inette the Nurse caring for the patient. States Depakote was d/c'd on 05/11/2016. Otherwise no changes to medications.  States pt typically mumbles/unintelligible speech but alert with eyes open and responsive when name is called, also usually able to ambulate to dining hall from room with staff assistance.

## 2016-05-16 DIAGNOSIS — N390 Urinary tract infection, site not specified: Secondary | ICD-10-CM | POA: Diagnosis present

## 2016-05-16 DIAGNOSIS — Z66 Do not resuscitate: Secondary | ICD-10-CM | POA: Diagnosis present

## 2016-05-16 DIAGNOSIS — R4182 Altered mental status, unspecified: Secondary | ICD-10-CM

## 2016-05-16 DIAGNOSIS — F0280 Dementia in other diseases classified elsewhere without behavioral disturbance: Secondary | ICD-10-CM

## 2016-05-16 DIAGNOSIS — G309 Alzheimer's disease, unspecified: Secondary | ICD-10-CM

## 2016-05-16 DIAGNOSIS — R627 Adult failure to thrive: Secondary | ICD-10-CM | POA: Diagnosis present

## 2016-05-16 DIAGNOSIS — B9620 Unspecified Escherichia coli [E. coli] as the cause of diseases classified elsewhere: Secondary | ICD-10-CM | POA: Diagnosis present

## 2016-05-16 DIAGNOSIS — E860 Dehydration: Secondary | ICD-10-CM | POA: Diagnosis present

## 2016-05-16 DIAGNOSIS — N179 Acute kidney failure, unspecified: Principal | ICD-10-CM

## 2016-05-16 DIAGNOSIS — E870 Hyperosmolality and hypernatremia: Secondary | ICD-10-CM

## 2016-05-16 DIAGNOSIS — I700 Atherosclerosis of aorta: Secondary | ICD-10-CM | POA: Diagnosis present

## 2016-05-16 DIAGNOSIS — I4891 Unspecified atrial fibrillation: Secondary | ICD-10-CM | POA: Diagnosis present

## 2016-05-16 LAB — BASIC METABOLIC PANEL
ANION GAP: 8 (ref 5–15)
ANION GAP: 10 (ref 5–15)
ANION GAP: 8 (ref 5–15)
ANION GAP: 5 (ref 5–15)
BUN: 25 mg/dL — ABNORMAL HIGH (ref 6–20)
BUN: 27 mg/dL — ABNORMAL HIGH (ref 6–20)
BUN: 31 mg/dL — ABNORMAL HIGH (ref 6–20)
BUN: 32 mg/dL — ABNORMAL HIGH (ref 6–20)
CALCIUM: 8.6 mg/dL — AB (ref 8.9–10.3)
CALCIUM: 8.5 mg/dL — AB (ref 8.9–10.3)
CALCIUM: 8.9 mg/dL (ref 8.9–10.3)
CALCIUM: 8.8 mg/dL — AB (ref 8.9–10.3)
CHLORIDE: 115 mmol/L — AB (ref 101–111)
CHLORIDE: 119 mmol/L — AB (ref 101–111)
CO2: 26 mmol/L (ref 22–32)
CO2: 22 mmol/L (ref 22–32)
CO2: 25 mmol/L (ref 22–32)
CO2: 25 mmol/L (ref 22–32)
Chloride: 109 mmol/L (ref 101–111)
Chloride: 109 mmol/L (ref 101–111)
Creatinine, Ser: 1.09 mg/dL — ABNORMAL HIGH (ref 0.44–1.00)
Creatinine, Ser: 1.11 mg/dL — ABNORMAL HIGH (ref 0.44–1.00)
Creatinine, Ser: 1.17 mg/dL — ABNORMAL HIGH (ref 0.44–1.00)
Creatinine, Ser: 1.24 mg/dL — ABNORMAL HIGH (ref 0.44–1.00)
GFR calc non Af Amer: 40 mL/min — ABNORMAL LOW (ref >60)
GFR calc non Af Amer: 43 mL/min — ABNORMAL LOW (ref >60)
GFR calc non Af Amer: 46 mL/min — ABNORMAL LOW (ref >60)
GFR, EST AFRICAN AMERICAN: 54 mL/min — AB (ref >60)
GFR, EST AFRICAN AMERICAN: 53 mL/min — AB (ref >60)
GFR, EST AFRICAN AMERICAN: 49 mL/min — AB (ref >60)
GFR, EST AFRICAN AMERICAN: 46 mL/min — AB (ref >60)
GFR, EST NON AFRICAN AMERICAN: 45 mL/min — AB (ref >60)
Glucose, Bld: 107 mg/dL — ABNORMAL HIGH (ref 65–99)
Glucose, Bld: 112 mg/dL — ABNORMAL HIGH (ref 65–99)
Glucose, Bld: 117 mg/dL — ABNORMAL HIGH (ref 65–99)
Glucose, Bld: 94 mg/dL (ref 65–99)
POTASSIUM: 3.6 mmol/L (ref 3.5–5.1)
POTASSIUM: 3.4 mmol/L — AB (ref 3.5–5.1)
POTASSIUM: 3.9 mmol/L (ref 3.5–5.1)
POTASSIUM: 3.7 mmol/L (ref 3.5–5.1)
Sodium: 141 mmol/L (ref 135–145)
Sodium: 143 mmol/L (ref 135–145)
Sodium: 148 mmol/L — ABNORMAL HIGH (ref 135–145)
Sodium: 149 mmol/L — ABNORMAL HIGH (ref 135–145)

## 2016-05-16 MED ORDER — DEXTROSE 5 % IV SOLN
INTRAVENOUS | Status: DC
  Administered 2016-05-16: 10:00:00 via INTRAVENOUS

## 2016-05-16 MED ORDER — SODIUM CHLORIDE 0.9 % IV SOLN
INTRAVENOUS | Status: DC
  Administered 2016-05-16: 18:00:00 via INTRAVENOUS

## 2016-05-16 MED ORDER — DEXTROSE 5 % IV SOLN
1.0000 g | INTRAVENOUS | Status: DC
  Administered 2016-05-16 – 2016-05-18 (×3): 1 g via INTRAVENOUS
  Filled 2016-05-16 (×3): qty 10

## 2016-05-16 NOTE — Progress Notes (Signed)
Initial Nutrition Assessment  DOCUMENTATION CODES:   Not applicable  INTERVENTION:   -Magic Cup TID with meals  NUTRITION DIAGNOSIS:   Inadequate oral intake related to lethargy/confusion as evidenced by meal completion < 50%.  GOAL:   Patient will meet greater than or equal to 90% of their needs  MONITOR:   PO intake, Supplement acceptance, Labs, Weight trends, Skin, I & O's  REASON FOR ASSESSMENT:   Low Braden    ASSESSMENT:   [name] was an 82 year old woman with history of Alzheimer disease that [facility] for altered mental status.  Patient is only responsive to painful stimuli and history was obtained from EMR. Per notes patient has not been as active as she normally is for the past 24 hours.  There has not been any reported fevers, vomiting, diarrhea, trauma, cough or congestion.  No treatment was given by EMS prior to arrival.  Pt admitted with AMS. [facility] a [facility].   Pt was lethargic at time of visit. She did not arouse when greeted or during exam.   Pt is currently on a dysphagia 1 diet with thin liquids. Noted minimal intake; pt consumed 50% of ice cream off meal tray.   Records from [facility] reviewed; PTA medications include vitamin D3, senna, and cyanocobalamin.   Nutrition-Focused physical exam completed. Findings are no fat depletion, no muscle depletion, and no edema.   Wt hx reviewed. UBW is around 120#.   Labs reviewed: Na: 148.   Diet Order:  DIET - DYS 1 Room service appropriate? Yes; Fluid consistency: Thin  Skin:  Reviewed, no issues  Last BM:  PTA  Height:   Ht Readings from Last 1 Encounters:  05/16/16 5\' 1"  (1.549 m)    Weight:   Wt Readings from Last 1 Encounters:  05/16/16 121 lb 7.6 oz (55.1 kg)    Ideal Body Weight:  47.7 kg  BMI:  Body mass index is 22.95 kg/m.  Estimated Nutritional Needs:   Kcal:  1350-1550  Protein:  65-80 grams  Fluid:   1.3-1.5 L  EDUCATION NEEDS:   No education needs identified at this time  Jevan Gaunt A. Mayford KnifeWilliams, RD, LDN, CDE Pager: [phone] After hours Pager: [phone]

## 2016-05-16 NOTE — Evaluation (Signed)
Occupational Therapy Evaluation Patient Details Name: Felicia CorriganBetty Mcdonald MRN: [id number] DOB: 1932-11-16 Today's Date: 05/16/2016    History of Present Illness Pt is an 82 year old female with history of Alzheimer disease that [facility] for altered mental status.    Clinical Impression   Pt admitted with above diagnosis. Pt required assistance with self care task PTA and likely at baseline at this time. Pt very lethargic during evaluation and does not open eyes this session. Cold cloth used to alert pt who verbalized. "Hey" only. Pt required total A for bed mobility this session and unable to follow cues. Pt remained supine in bed after repositioning by therapist and NT present to assist with self care. OT signing off at this time.  Please send text page to OT services if any questions, concerns, or with new orders. Re consult if needed.  Thank you for the order.     Follow Up Recommendations  SNF    Equipment Recommendations  Other (comment) (defer to next venue of care)    Recommendations for Other Services Other (comment) (none at this time)     Precautions / Restrictions Precautions Precautions: Fall      Mobility Bed Mobility Overal bed mobility: Needs Assistance Bed Mobility: Rolling Rolling: Total assist   Supine to sit: Max assist Sit to supine: Max assist   General bed mobility comments: Total A to roll L <>R. Pt unable to follow verbal commands for task.  Transfers                 General transfer comment: unable secondary to safety concerns    Balance Overall balance assessment: Needs assistance Sitting-balance support: No upper extremity supported;Feet unsupported Sitting balance-Leahy Scale: Fair   Postural control: Posterior lean            ADL Overall ADL's : At baseline          General ADL Comments: Per chart and staff, pt needing assistance with all aspects of self care PTA. OT placing  cold cloth on pt's face in an attempt to increase alertness. Pt did verbalize "Hey" once but never opened eyes or attempted to turn away from cloth.                Pertinent Vitals/Pain Pain Assessment: Faces Faces Pain Scale: No hurt     Hand Dominance     Extremity/Trunk Assessment Upper Extremity Assessment Upper Extremity Assessment: Generalized weakness   Lower Extremity Assessment Lower Extremity Assessment: Defer to PT evaluation RLE Deficits / Details: Tightness hip/knee flexors. Lacking ~ 15 degrees of extension  LLE Deficits / Details: Tightness hip/knee flexors. Lacking ~ 15 degrees of extension    Cervical / Trunk Assessment Cervical / Trunk Assessment: Kyphotic   Communication Communication Communication: Expressive difficulties;Receptive difficulties   Cognition Arousal/Alertness: Lethargic Behavior During Therapy: Flat affect Overall Cognitive Status: Difficult to assess                 General Comments: Pt has dementia with noncoherent speech at baseline. She is currently has eyes closed throughout session.               Home Living Family/patient expects to be discharged to:: Skilled nursing facility                 Prior Functioning/Environment Level of Independence: Needs assistance  Gait / Transfers Assistance Needed: per chart, ambulated to dining room with  staff assist                    OT Goals(Current goals can be found in the care plan section) Acute Rehab OT Goals Patient Stated Goal: unable to state  OT Frequency:                End of Session    Activity Tolerance: Patient limited by lethargy Patient left: in bed;with call bell/phone within reach;with bed alarm set;with nursing/sitter in room   Time: [id number] OT Time Calculation (min): 16 min Charges:  OT General Charges $OT Visit: 1 Procedure OT Evaluation $OT Eval Moderate Complexity: 1 Procedure G-Codes:    Terrelle Ruffolo P, MS, OTR/L,  CBIS 05/16/2016, 10:06 AM

## 2016-05-16 NOTE — NC FL2 (Signed)
  [place] MEDICAID FL2 LEVEL OF CARE SCREENING TOOL     IDENTIFICATION  Patient Name: Felicia Mcdonald Birthdate: 1932/08/29 Sex: female Admission Date (Current Location): 05/15/2016  [facility] [place]   Facility and Address:  [facility] [place], [address], [place], [place] [address]      Provider Number: [id number]  Attending Physician Name and Address:  Earl LagosNischal Narendra, MD  Relative Name and Phone Number:       Current Level of Care: Hospital Recommended Level of Care: Skilled Nursing Facility Prior Approval Number:    Date Approved/Denied: 09/15/10 PASRR Number:  [id number]   Discharge Plan: SNF    Current Diagnoses: Patient Active Problem List   Diagnosis Date Noted  . Altered mental status 05/15/2016  . Vitamin B12 deficiency 03/20/2014  . Anemia of other chronic disease 07/31/2013  . Pure hypercholesterolemia 07/12/2013  . Vitamin D deficiency 06/14/2013  . Rash and other nonspecific skin eruption 04/12/2013  . Alzheimer's disease 11/07/2012  . Other B-complex deficiencies 11/07/2012  . Constipation 11/07/2012  . Encounter for long-term (current) use of other medications 11/07/2012  . Psychosis 09/27/2012    Orientation RESPIRATION BLADDER Height & Weight     Self, Time, Situation, Place  Normal Incontinent Weight: 121 lb 7.6 oz (55.1 kg) (bedscale) Height:  5\' 1"  (154.9 cm)  BEHAVIORAL SYMPTOMS/MOOD NEUROLOGICAL BOWEL NUTRITION STATUS      Continent  (Please see discharge summary)  AMBULATORY STATUS COMMUNICATION OF NEEDS Skin   Extensive Assist Verbally Normal                       Personal Care Assistance Level of Assistance  Bathing, Feeding, Dressing Bathing Assistance: Maximum assistance Feeding assistance: Independent Dressing Assistance: Maximum assistance     Functional Limitations Info  Sight, Hearing, Speech Sight Info: Adequate Hearing Info: Adequate Speech Info: Adequate    SPECIAL  CARE FACTORS FREQUENCY  PT (By licensed PT), OT (By licensed OT)     PT Frequency: min 2x week OT Frequency: min 2x week            Contractures Contractures Info: Not present    Additional Factors Info  Code Status, Allergies Code Status Info: DNR Allergies Info: No known allergies           Current Medications (05/16/2016):  This is the current hospital active medication list Current Facility-Administered Medications  Medication Dose Route Frequency Provider Last Rate Last Dose  . cefTRIAXone (ROCEPHIN) 1 g in dextrose 5 % 50 mL IVPB  1 g Intravenous Q24H Valentino NoseNathan Boswell, MD   1 g at 05/16/16 1233  . cholecalciferol (VITAMIN D) tablet 2,000 Units  2,000 Units Oral Daily Valentino NoseNathan Boswell, MD      . dextrose 5 % solution   Intravenous Continuous Valentino NoseNathan Boswell, MD 75 mL/hr at 05/16/16 0953    . heparin injection 5,000 Units  5,000 Units Subcutaneous Q8H Valentino NoseNathan Boswell, MD   5,000 Units at 05/16/16 1350  . senna-docusate (Senokot-S) tablet 1 tablet  1 tablet Oral QHS Valentino NoseNathan Boswell, MD         Discharge Medications: Please see discharge summary for a list of discharge medications.  Relevant Imaging Results:  Relevant Lab Results:   Additional Information SSN: [id number]  Volney AmericanBridget A Mayton, LCSW

## 2016-05-16 NOTE — Evaluation (Addendum)
Clinical/Bedside Swallow Evaluation Patient Details  Name: Felicia CorriganBetty Mcdonald MRN: [id number] Date of Birth: 1933-01-02  Today's Date: 05/16/2016 Time: SLP Start Time (ACUTE ONLY): 1200 SLP Stop Time (ACUTE ONLY): 1217 SLP Time Calculation (min) (ACUTE ONLY): 17 min  Past Medical History:  Past Medical History:  Diagnosis Date  . Alzheimer's disease 11/07/2012  . Other B-complex deficiencies 11/07/2012  . Unspecified constipation 11/07/2012   Past Surgical History: No past surgical history on file. HPI:  Pt is an 82 year old female with history of Alzheimer disease that [facility] by [facility] for altered mental status.   Assessment / Plan / Recommendation Clinical Impression  Bedside swallow evaluation complete.  Patient lethargic, no eye opening, and limited participated during evaluation; however, was accepting of PO. Patient with swift swallows duirng consecutive sips of thin liquids via straw. Oral manipualtion notable for chomping, smacking pattern, with increased time for oral clearance. Patient with no overt s/s of aspiration and given the chest x-ray revealed minimal bibasilar opacities recommend patient resume diet from prior to admission, Dys.1 textures and thin liquids via straw and Total assist with PO when patient is accepting.  SLP to follow briefly to ensure tolerance.        Aspiration Risk  Mild aspiration risk    Diet Recommendation Dysphagia 1 (Puree);Thin liquid   Liquid Administration via: Straw Medication Administration: Crushed with puree Supervision: Staff to assist with self feeding;Full supervision/cueing for compensatory strategies Compensations: Slow rate;Small sips/bites;Other (Comment) (Go slow, feed when accepting of PO) Postural Changes: Seated upright at 90 degrees    Other  Recommendations Oral Care Recommendations: Oral care BID   Follow up Recommendations Other (comment) (TBD, but likely none)      Frequency and  Duration min 2x/week  1 week       Prognosis Prognosis for Safe Diet Advancement: Guarded Barriers to Reach Goals: Other (Comment) (pt on diet same as PTA)      Swallow Study   General HPI: Pt is an 82 year old female with history of Alzheimer disease that [facility] for altered mental status. Type of Study: Bedside Swallow Evaluation Previous Swallow Assessment: none on record  Diet Prior to this Study: Other (Comment) (full liquids ) Temperature Spikes Noted: No Respiratory Status: Room air History of Recent Intubation: No Behavior/Cognition: Cooperative;Lethargic/Drowsy;Requires cueing Oral Cavity Assessment: Dry Oral Care Completed by SLP: Recent completion by staff Oral Cavity - Dentition: Edentulous Vision:  (UTA) Self-Feeding Abilities: Total assist Patient Positioning: Upright in bed Baseline Vocal Quality: Not observed Volitional Cough: Cognitively unable to elicit Volitional Swallow: Unable to elicit    Oral/Motor/Sensory Function Overall Oral Motor/Sensory Function: Generalized oral weakness   Ice Chips Ice chips: Not tested   Thin Liquid Thin Liquid: Within functional limits Presentation: Straw    Nectar Thick Nectar Thick Liquid: Not tested   Honey Thick Honey Thick Liquid: Not tested   Puree Puree: Impaired Presentation: Spoon Oral Phase Impairments: Other (comment) (prolonged oral clearance ) Oral Phase Functional Implications: Oral residue Pharyngeal Phase Impairments: Other (comments) (none)   Solid   GO   Solid: Not tested    Functional Assessment Tool Used: skilled clinical judgement  Functional Limitations: Swallowing Swallow Current Status (E9528(G8996): At least 60 percent but less than 80 percent impaired, limited or restricted Swallow Goal Status [phone]([id number]): At least 40 percent but less than 60 percent impaired, limited or restricted  Fae PippinMelissa Lilybeth Vien,  M.A.,  CCC-SLP [phone]  [name] 05/16/2016,1:38 PM

## 2016-05-16 NOTE — Progress Notes (Signed)
  Date: 05/16/2016  Patient name: Felicia Mcdonald  Medical record number: [id number]  Date of birth: 10/09/1932   I have seen and evaluated Felicia Mcdonald and discussed their care with the Residency Team. In brief, patient is a 82 year old female with past medical history of Alzheimer's dementia who resides in a nursing home who presented with altered mental status 1 day. History is obtained from chart as patient is unresponsive to painful stimuli. Per chart, patient at baseline is able to open eyes, has nonsensical speech and walks with assistance. She needs assistance with all her ADLs. Yesterday she did not open her eyes and would not eat. She was brought to the ED for follow-up. In ED she was noted to have hypernatremia with a sodium of 152 as well as acute kidney injury with a creatinine of 1.57 from a baseline creatinine of 0.8.  Today, patient remains obtunded. She does not open her eyes to voice but does withdraw from painful stimuli. Per speech eval patient able to tolerate dysphagia 1 diet and is able to sip from a straw   PMHx, Fam Hx, and/or Soc Hx : As per resident admit note  Vitals:   05/15/16 2111 05/16/16 0549  BP: 131/78 140/84  Pulse: 81 75  Resp: 19 17  Temp: 98 F (36.7 C) 97.7 F (36.5 C)   Gen.: Patient responds only to painful stimuli, eyes remain closed, does not follow commands CVS: Regular rate and rhythm, normal heart sounds Lungs: Clear to auscultation bilaterally Abdomen: Soft, nontender, normoactive bowel sounds Extremities: No edema noted  Assessment and Plan: I have seen and evaluated the patient as outlined above. I agree with the formulated Assessment and Plan as detailed in the residents' note, with the following changes:   1. Altered mental status: - Patient presents with altered mental status 1 day from her nursing facility. Patient at baseline needs assistance with all her ADLs and only has nonsensical speech. Patient remains altered today and only  withdraws to painful stimulus. She is able to take liquids by mouth with a straw. - Altered mental status is likely secondary to an underlying urinary tract infection. Urine culture growing greater than 100,000 Escherichia coli. We will start treatment with IV ceftriaxone today. - We will follow-up sensitivities on urine culture  2. Acute kidney injury/hypernatremia: - This is likely secondary to dehydration. Patient with a free water deficit of approximately 2 L. - Sodium has normalized now over the course of 24 hours - Creatinine continues to improve with IV hydration. We will monitor BMPs daily - Nutrition consult appreciated. We will continue dysphagia 1 diet for now  Earl LagosNischal Debroh Sieloff, MD 12/11/20174:17 PM

## 2016-05-16 NOTE — Progress Notes (Signed)
   Subjective: Patient was evaluated this morning on rounds. She responded to painful stimuli. She would not open her eyes.  She made a notable chomping/smacking pattern.  Objective:  Vital signs in last 24 hours: Vitals:   05/15/16 1630 05/15/16 2111 05/16/16 0549 05/16/16 1403  BP: 132/75 131/78 140/84   Pulse: 75 81 75   Resp: 15 19 17    Temp:  98 F (36.7 C) 97.7 F (36.5 C)   TempSrc:  Oral Oral   SpO2: 96% 94% 96%   Weight:    121 lb 7.6 oz (55.1 kg)  Height:    5\' 1"  (1.549 m)   Physical Exam  Constitutional:  Frail appearing   Cardiovascular: Normal rate, regular rhythm and normal heart sounds.   Pulmonary/Chest: Effort normal and breath sounds normal. No respiratory distress. She has no wheezes. She has no rales.  Abdominal: Soft. She exhibits no distension.  Musculoskeletal: She exhibits no edema.  Skin: Skin is warm and dry.     Assessment/Plan:  Active Problems:   Altered mental status  Altered mental status After IV fluids patient has moist mucus membrane and skin tenting has improved.  Her sodium improved to 148 and creatinine to 1.17.  Her baseline sodium is144 and creatinine 0.88.  Patient does not respond to name and continues to keep eyes closed.  UA showed no leukocytes or nitrites.  Urine culture grew E.coli.  Will start on antibiotics.  Her current UTI may be contributing to her change in mental status.        - IV ceftriaxone - BMET in the morning   UTI Patient had urine culture that grew E.coli - IV ceftriaxone  Hypernatremia Improved with 1/2 IV NS.  Will discontinue and give D5W at 5075ml/hr   - IV D5W - BMET in morning   Alzheimer Baseline includes nonsensical speech, opening eyes and response to name.  Patient does not appear at her baseline mentation today. -monitor for clinical improvement to baseline mental status after IV fluids  Failure to thrive Patient's medications include vitamin D, senna and vitamin B12 monthly injections.   Patient was evaluated by physical therapy and recommendations included SNF.  Speech pathology assessed patient and recommended a dysphagia 1 (puree): Thin diet     - possible SNF on discharge - dysphagia 1 diet   Diet: dysphagia 1 diet Code status: DNR ~ document [facility] DVT prophylaxis: heparin  Dispo: Anticipated discharge in approximately 2 day(s).   Camelia PhenesJessica Ratliff Shakelia Scrivner, DO 05/16/2016, 2:24 PM Pager: [phone]

## 2016-05-16 NOTE — Care Management Obs Status (Signed)
MEDICARE OBSERVATION STATUS NOTIFICATION   Patient Details  Name: Netta CorriganBetty Goetzke MRN: [id number] Date of Birth: 01-03-1933   Medicare Observation Status Notification Given:  Yes  Patient has history of Alzheimer diease and nonverbal . Daughter Viona GilmoreBrenda Fraser at bedside . Letter explained to Ms Leretha DykesFraser who voiced understanding and signed letter.  Kingsley PlanWile, Aerionna Moravek Marie, RN 05/16/2016, 11:09 AM

## 2016-05-16 NOTE — Evaluation (Signed)
Physical Therapy Evaluation Patient Details Name: Felicia CorriganBetty Campusano MRN: [id number] DOB: January 05, 1933 Today's Date: 05/16/2016   History of Present Illness  Pt is an [age] year old female with history of Alzheimer disease that [facility] the [facility] for altered mental status.   Clinical Impression  Pt admitted with above diagnosis. Pt currently with functional limitations due to the deficits listed below (see PT Problem List). On eval, pt very lethargic and difficult to arouse. Mobility limited to EOB. See eval details below. Pt will benefit from skilled PT to increase their independence and safety with mobility to allow discharge to the venue listed below.  We will do a trial of PT to see if pt's alertness and participation increases since she was ambulatory at baseline.     Follow Up Recommendations SNF    Equipment Recommendations  None recommended by PT    Recommendations for Other Services       Precautions / Restrictions Precautions Precautions: Fall      Mobility  Bed Mobility Overal bed mobility: Needs Assistance Bed Mobility: Supine to Sit;Sit to Supine     Supine to sit: Max assist Sit to supine: Max assist   General bed mobility comments: Pt able to sit EOB 45 sec min assist before becoming retropulsive requiring return to supine.  Transfers                 General transfer comment: unable   Ambulation/Gait             General Gait Details: unable  Stairs            Wheelchair Mobility    Modified Rankin (Stroke Patients Only)       Balance Overall balance assessment: Needs assistance Sitting-balance support: No upper extremity supported;Feet unsupported Sitting balance-Leahy Scale: Fair   Postural control: Posterior lean                                   Pertinent Vitals/Pain Pain Assessment: Faces Faces Pain Scale: No hurt    Home Living Family/patient expects to be discharged to::  Skilled nursing facility                      Prior Function Level of Independence: Needs assistance   Gait / Transfers Assistance Needed: per chart, ambulated to dining room with staff assist           Hand Dominance        Extremity/Trunk Assessment   Upper Extremity Assessment: Defer to OT evaluation           Lower Extremity Assessment: RLE deficits/detail;LLE deficits/detail RLE Deficits / Details: Tightness hip/knee flexors. Lacking ~ 15 degrees of extension  LLE Deficits / Details: Tightness hip/knee flexors. Lacking ~ 15 degrees of extension   Cervical / Trunk Assessment: Kyphotic  Communication   Communication: Expressive difficulties;Receptive difficulties  Cognition Arousal/Alertness: Lethargic Behavior During Therapy: Flat affect Overall Cognitive Status: Difficult to assess                 General Comments: Pt has dementia with noncoherent speech at baseline. She is currently nonverbal with eyes closed.    General Comments      Exercises     Assessment/Plan    PT Assessment Patient needs continued PT services  PT Problem List Decreased activity tolerance;Decreased strength;Decreased mobility;Decreased cognition;Decreased balance;Decreased safety  awareness          PT Treatment Interventions Therapeutic activities;Patient/family education;Balance training;Functional mobility training;Therapeutic exercise;Gait training;Cognitive remediation    PT Goals (Current goals can be found in the Care Plan section)  Acute Rehab PT Goals Patient Stated Goal: unable to state PT Goal Formulation: Patient unable to participate in goal setting Time For Goal Achievement: 05/30/16 Potential to Achieve Goals: Fair    Frequency Min 2X/week   Barriers to discharge        Co-evaluation               End of Session   Activity Tolerance: Patient limited by lethargy Patient left: in bed;with bed alarm set;with call bell/phone within  reach Nurse Communication: Mobility status    Functional Assessment Tool Used: clinical judgement Functional Limitation: Mobility: Walking and moving around Mobility: Walking and Moving Around Current Status (M8413(G8978): At least 80 percent but less than 100 percent impaired, limited or restricted Mobility: Walking and Moving Around Goal Status [phone]): At least 40 percent but less than 60 percent impaired, limited or restricted    Time: 0853-0905 PT Time Calculation (min) (ACUTE ONLY): 12 min   Charges:   PT Evaluation $PT Eval Moderate Complexity: 1 Procedure     PT G Codes:   PT G-Codes **NOT FOR INPATIENT CLASS** Functional Assessment Tool Used: clinical judgement Functional Limitation: Mobility: Walking and moving around Mobility: Walking and Moving Around Current Status ([id number](G8978): At least 80 percent but less than 100 percent impaired, limited or restricted Mobility: Walking and Moving Around Goal Status [phone]([phone]): At least 40 percent but less than 60 percent impaired, limited or restricted    Ilda FoilGarrow, Tiwanda Threats Rene 05/16/2016, 9:53 AM

## 2016-05-17 LAB — BASIC METABOLIC PANEL
ANION GAP: 10 (ref 5–15)
BUN: 19 mg/dL (ref 6–20)
CALCIUM: 9 mg/dL (ref 8.9–10.3)
CO2: 23 mmol/L (ref 22–32)
CREATININE: 1.12 mg/dL — AB (ref 0.44–1.00)
Chloride: 110 mmol/L (ref 101–111)
GFR calc Af Amer: 52 mL/min — ABNORMAL LOW (ref >60)
GFR, EST NON AFRICAN AMERICAN: 45 mL/min — AB (ref >60)
GLUCOSE: 98 mg/dL (ref 65–99)
Potassium: 3.5 mmol/L (ref 3.5–5.1)
Sodium: 143 mmol/L (ref 135–145)

## 2016-05-17 MED ORDER — CEPHALEXIN 500 MG PO CAPS: 500.0000 mg | ORAL_CAPSULE | Freq: Two times a day (BID) | ORAL | 0 refills | Status: AC

## 2016-05-17 NOTE — Discharge Summary (Signed)
Name: Felicia CorriganBetty Mcdonald MRN: [id number] DOB: 07-29-1932 82 y.o. PCP: No primary care provider on file.  Date of Admission: 05/15/2016 12:13 PM Date of Discharge: 05/18/2016 Attending Physician: Earl LagosNischal Narendra, MD  Discharge Diagnosis: 1.  Altered mental status 2. Urinary Tract Infection 3.  Hypernatremia   Discharge Medications:   Medication List    TAKE these medications   cephALEXin 500 MG capsule Commonly known as:  KEFLEX Take 1 capsule (500 mg total) by mouth 2 (two) times daily.   cholecalciferol 1000 units tablet Commonly known as:  VITAMIN D Take 2,000 Units by mouth daily.   sennosides-docusate sodium 8.6-50 MG tablet Commonly known as:  SENOKOT-S Take 1 tablet by mouth at bedtime.   VITAMIN B-12 IJ Inject 1 mL as directed every 30 (thirty) days.       Disposition and follow-up:   Felicia Mcdonald [facility] in [facility] condition.  At the hospital follow up visit please address:  1.  Oral intake, antibiotic completion for Urinary Tract Infection  2.  Labs / imaging needed at time of follow-up: BMET  3.  Pending labs/ test needing follow-up: none  Follow-up Appointments:   Hospital Course by problem list:    Altered mental status Patient was transferred from skilled nursing facility in [place] to [facility] for change in mental status.  I spoke with the nurse at the SNF and was told that the patient was not her usual self at breakfast the day prior to admission.  She states that the patient's baseline is to be able to open eyes, have non-sensible speech and walk with assistance. She also needs assistance with feedings.  At breakfast in the SNF she wouldn't eat and she would keep her eyes closed when talked to.  On day of admission her sodium was 152 and creatinine 1.57.  Her baseline sodium is 144 and creatinine 0.88 from labs obtained a month ago.  On exam patient had dry mucous membranes and skin tenting.   IV 1/2 NS was given and her sodium corrected to 143 over 24hrs.  Her creatinine on discharge was 1.12.  On day of discharge patient was able to open her eyes and look around the room.  The day prior she was able to respond with nonsensical speech and when talking with the daughter she reported that the patient was at her baseline mentation.  She was evaluated by speech pathology and recommended a dysphagia 1 diet.  Patient ate a small portion of grits and ice cream prior to discharge.  Patient was also found to have a UTI.  Her change in mental status was likely contributed by dehydration and UTI.  UTI Patient had urine culture that grew E.coli that was sensitive to ceftriaxone and cefazolin. Prior to discharge she will have received three doses of ceftriaxone.  She was discharged with a 5 day course of keflex.  Hypernatremia Her elevated sodium at 152 on admission was likely due to dehydration.  Labs on 04/08/16 showed Na to be 144.  She was given IV 1/2 normal saline and over 24hrs her sodium corrected to 143.   Discharge Vitals:   BP 123/68 (BP Location: Right Arm)   Pulse 60   Temp 98.5 F (36.9 C) (Oral)   Resp 18   Ht 5\' 1"  (1.549 m)   Wt 121 lb 7.6 oz (55.1 kg) Comment: bedscale  SpO2 96%   BMI 22.95 kg/m   Pertinent Labs, Studies, and Procedures:  BMET, UA and urine culture  Discharge Instructions:  Please finish your five day course of antibiotics   Signed: Camelia PhenesJessica Ratliff Twania Bujak, DO 05/18/2016, 6:54 AM   Pager: [phone]

## 2016-05-17 NOTE — Progress Notes (Signed)
Notified Dr. Obie DredgeBlum of patient's BP 108/57 at 2020. Dr. Obie DredgeBlum stated to recheck BP manually and notify of results. Rechecked patient's BP manually in both arms and notified Dr. Obie DredgeBlum of results via text page. Dr. Obie DredgeBlum returned call acknowledging patient's BP. No further orders given.

## 2016-05-17 NOTE — Progress Notes (Addendum)
   Subjective: Patient was evaluated this morning on rounds. Patient was resting comfortably in bed.  When speaking to patient she was able to respond with nonsensical speech.  Next to the bed was half eaten ice cream, grits and half a glass of orange juice.  Objective:  Vital signs in last 24 hours: Vitals:   05/16/16 2030 05/16/16 2033 05/17/16 0536 05/17/16 1005  BP: 118/66 116/60 (!) 142/68 (!) 95/49  Pulse:   73 68  Resp:   16 17  Temp:   98.4 F (36.9 C) 97.8 F (36.6 C)  TempSrc:   Oral Oral  SpO2:   97% 96%  Weight:      Height:       Physical Exam  Constitutional:  Frail appearing   HENT:  Mouth/Throat: Oropharynx is clear and moist.  Cardiovascular: Normal rate, regular rhythm and normal heart sounds.  Exam reveals no gallop and no friction rub.   No murmur heard. Pulmonary/Chest: Effort normal and breath sounds normal. No respiratory distress. She has no wheezes. She has no rales.  Abdominal: Soft. She exhibits no distension.  Musculoskeletal: She exhibits no edema.  Skin: Skin is warm and dry.    Assessment/Plan:  Altered mental status IV fluids were discontinued after sodium was corrected. Her sodium is currently 143 and creatinine to 1.12. Her baseline sodium is 144 and creatinine 0.88. Patient was able to respond with nonsensical speech and would smile during the exam. Urine culture grew E.coli and sensitive to ceftriaxone and cefazolin.  Will transition to keflex on discharge.  With an improvement in mental status from baseline, UTI and dehydration was likely contributing to her change in mentation. - IV ceftriaxone, transition to keflex on discharge  UTI Patient had urine culture that grew E.coli and sensitive to ceftriaxone and cefazolin.  Prior to discharge she will have received two doses of ceftriaxone.  Will discharge with 5 day course of keflex. - IV ceftriaxone  Hypernatremia Resolved. Fluids were discontinued after sodium was corrected over  24hrs. Current sodium is 143  Alzheimer Per nursing facility baseline includes nonsensical speech, opening eyes and response to name. Patient was able to respond to her name and make nonsensical speech this morning.  Spoke with daughter this morning and reports that patient is back to her baseline from 2 weeks prior.   - Discharge back to [place]   Failure to thrive Patient's medications include vitamin D, senna and vitamin B12 monthly injections.  Patient was evaluated by physical therapy and recommendations included SNF.  We will discharge to her back to her current SNF at [place].  Speech pathology assessed patient and recommended a dysphagia 1 (puree): Thin diet    - Discharge to [place] - dysphagia 1 diet  Dispo: Anticipated discharge today.   Camelia PhenesJessica Ratliff Avyukt Cimo, DO 05/17/2016, 10:54 AM Pager: [phone]

## 2016-05-17 NOTE — Clinical Social Work Note (Signed)
[facility] pt tomorrow. CSW notified resident. CSW will facilitate transportation tomorrow.  [address]Felicia Mcdonald, [place] [phone]

## 2016-05-17 NOTE — Progress Notes (Signed)
Internal Medicine Attending:   I saw and examined the patient. I reviewed the resident's note and I agree with the resident's findings and plan as documented in the resident's note.  Patient appears to have improved a little bit today. Resident spoke with daughter who states that patient is now at baseline. We will continue with IV ceftriaxone for UTI and transition her to by mouth Keflex in the morning to complete a five-day course. Patient was also noted to have hypernatremia as well as acute renal failure on admission. Her creatinine has continued to improve with IV fluids and her sodium has now normalized. Patient is stable for discharge back to nursing facility once bed available.

## 2016-05-18 LAB — URINE CULTURE

## 2016-05-18 NOTE — Progress Notes (Signed)
Transport is here to pick patient up. [facility]

## 2016-05-18 NOTE — Progress Notes (Signed)
Internal Medicine Attending:   I saw and examined the patient. I reviewed the resident's note and I agree with the resident's findings and plan as documented in the resident's note.  Patient is more alert this morning and opens her eyes on rounds. Per history obtained from nursing facility this is her baseline. We will transition her from IV ceftriaxone to by mouth Keflex to complete a seven-day course for her urinary tract infection with pansensitive Escherichia coli. Hypernatremia now resolved and her last sodium was 143. Patient is tolerating a dysphagia 1 diet and was evaluated by speech. She is stable for discharge back to nursing facility today.

## 2016-05-18 NOTE — Progress Notes (Signed)
   Subjective: Patient was evaluated this morning on rounds.  Patient responded to her name by opening her eyes and looking around the room.    Objective:  Vital signs in last 24 hours: Vitals:   05/17/16 1005 05/17/16 1304 05/17/16 2114 05/18/16 0551  BP: (!) 95/49 97/66 (!) 120/55 123/68  Pulse: 68 67 72 60  Resp: 17 18 18 18   Temp: 97.8 F (36.6 C) 98.2 F (36.8 C) 98.2 F (36.8 C) 98.5 F (36.9 C)  TempSrc: Oral Oral Oral Oral  SpO2: 96% 97% 98% 96%  Weight:      Height:       Physical Exam  Constitutional:  Frail appearing  HENT:  Mouth/Throat: Oropharynx is clear and moist.  Cardiovascular: Normal rate, regular rhythm and normal heart sounds.  Exam reveals no gallop and no friction rub.   No murmur heard. Pulmonary/Chest: Effort normal and breath sounds normal. No respiratory distress. She has no wheezes. She has no rales.  Abdominal: Soft. She exhibits no distension.  Musculoskeletal: She exhibits no edema.  Skin: Skin is warm and dry.    Assessment/Plan:  Altered mental status At baseline.  Patient was ready for discharge yesterday.  Patient [facility] have [facility] [place] [facility] [place] [facility] [place].  [facility] is available.  - IV ceftriaxone , transition to keflex on discharge  UTI Patient had urine culture that grew E.coli and sensitive to ceftriaxone and cefazolin.   Will discharge with 5 day course of keflex. - IV ceftriaxone today for total of 3 doses while inpatient  Hypernatremia Resolved. Fluids were discontinued after sodium was corrected over 24hrs. Current sodium is 143  Alzheimer Stable, at baseline - Discharge back to [place]   Failure to thrive Patient's medications include vitamin D, senna and vitamin B12 monthly injections. Patient was evaluated by physical therapy and recommendations included SNF. We will discharge her back to her current SNF at [place].  Speech pathology  assessedpatient and recommended a dysphagia 1 (puree): Thin diet  - Discharge to [place] - dysphagia 1 diet  Dispo: Anticipated discharge today.   Camelia PhenesJessica Ratliff Margel Joens, DO 05/18/2016, 8:45 AM Pager: [phone]

## 2016-05-18 NOTE — Progress Notes (Signed)
Speech Language Pathology Treatment: Dysphagia  Patient Details Name: Felicia Mcdonald MRN: [id number] DOB: 11-19-1932 Today's Date: 05/18/2016 Time: 1030-1314 SLP Time Calculation (min) (ACUTE ONLY): 8 min  Assessment / Plan / Recommendation Clinical Impression  Skilled treatment session focused on addressing dysphagia goals. Patient more alert today and responsive to verbal stimuli.  SLP facilitated session by providing set-up and Total assist for feeding and Mod cues for initiation of PO; however, patient then Mod I for portion control and pacing from spoon and straw.  Patient requires extra time to clear oral cavity and continues to demonstrate no overt s/s of aspiration with PO.  Patient has returned to baseline and no further skilled SLP services are warranted at this time.  SLP will sign off.     HPI HPI: Pt is an 82 year old female with history of Alzheimer disease that presented to the ED from [facility] home by EMS for altered mental status.      SLP Plan  All goals met     Recommendations  Diet recommendations: Dysphagia 1 (puree);Thin liquid Liquids provided via: Straw Medication Administration: Crushed with puree Supervision: Staff to assist with self feeding Compensations: Slow rate;Small sips/bites Postural Changes and/or Swallow Maneuvers: Seated upright 90 degrees                Oral Care Recommendations: Oral care BID Follow up Recommendations: None Plan: All goals met       GO              Carmelia Roller., CCC-SLP [phone]  Felicia Mcdonald 05/18/2016, 10:40 AM

## 2016-05-18 NOTE — Progress Notes (Signed)
Gave report to Sherry RuffingAntoinette, [facility]. Transport has been called. Patient will be returning to facitily.

## 2016-05-18 NOTE — Progress Notes (Addendum)
LCSW following case as patient will be ready for discharge. [facility] and confirmed. All documents sent to facility via Epic. Awaiting MD to place order and DC progress note.  LCSW will facilitate transport as well as call family and notify of discharge. Daughter Steward DroneBrenda at the bedside and agreeable for discharge. Answered all questions and explained DC process. No other needs.  DC to SNF today. No barriers to DC.  Deretha EmoryHannah Destinie Thornsberry LCSW, MSW Clinical Social Work: Optician, dispensingystem [employer] Coverage for :  [phone]

## 2016-10-04 DEATH — deceased

## 2017-09-24 IMAGING — CT CT CERVICAL SPINE W/O CM
4 of 7 series · 14 of 33 positions shown, 16 images · non-contrast
Comparison: None.

CLINICAL DATA: Fall.  Alzheimer's disease.  Initial encounter.

EXAM:
CT HEAD WITHOUT CONTRAST
CT CERVICAL SPINE WITHOUT CONTRAST
TECHNIQUE: Multidetector CT imaging of the head and cervical spine was
performed following the standard protocol without intravenous
contrast. Multiplanar CT image reconstructions of the cervical spine
were also generated.

[Series 4: bone windows · axial · 0.43mm/px · z∈[-114,-69]mm · 2 of 47 slices shown]
[im 16/47  bone]
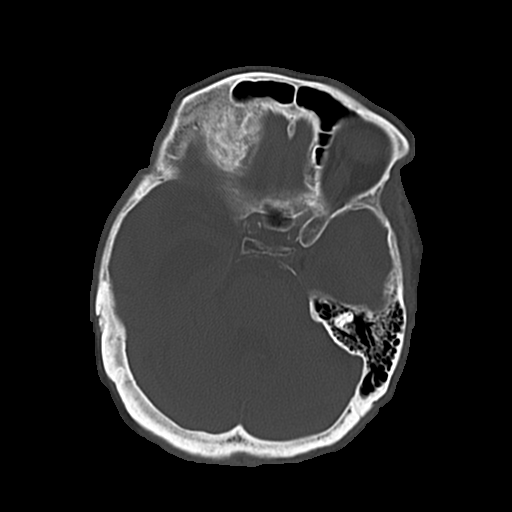
[im 31/47  bone]
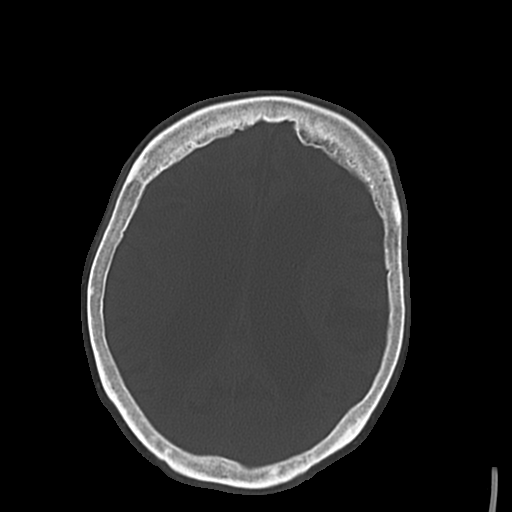

[Series 6: c-spine st · axial · 0.29mm/px · z∈[-284,-178]mm · 5 of 81 slices shown, 7 images]
[im 14/81  soft-tissue]
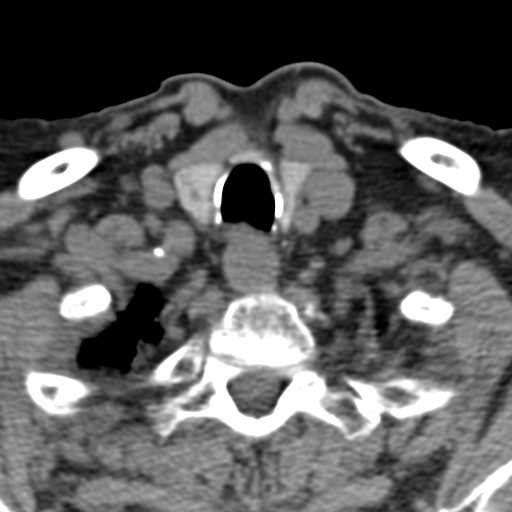
[im 14/81  bone]
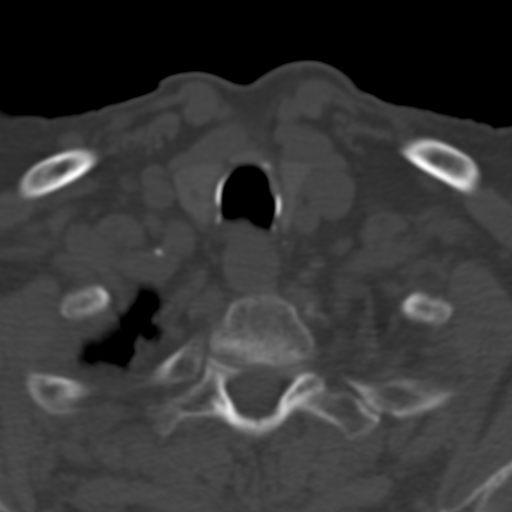
[im 27/81  bone]
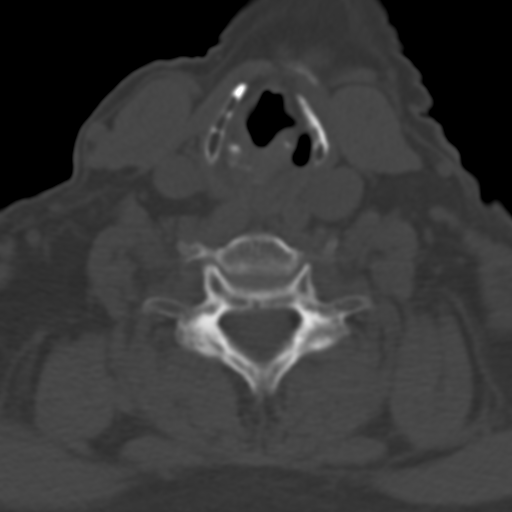
[im 41/81  bone]
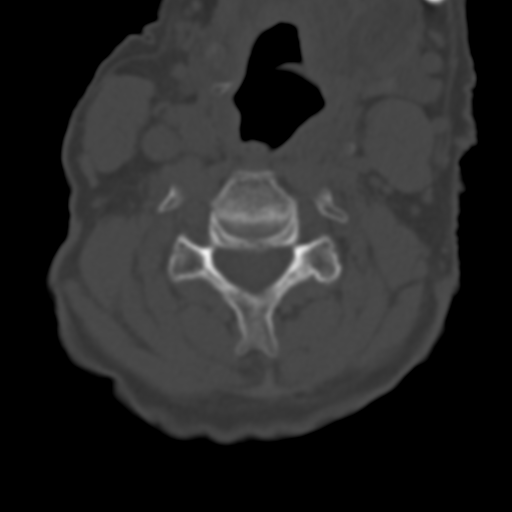
[im 54/81  bone]
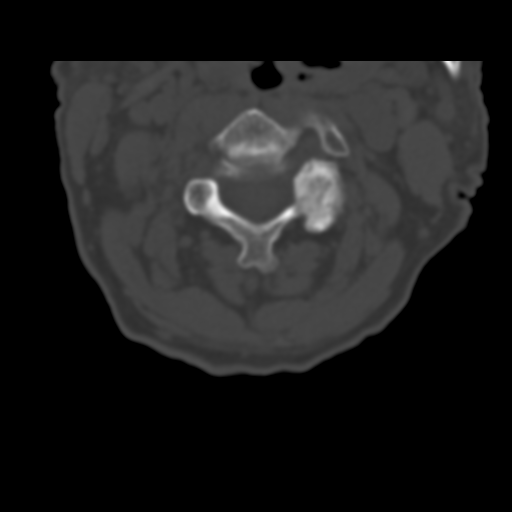
[im 67/81  soft-tissue]
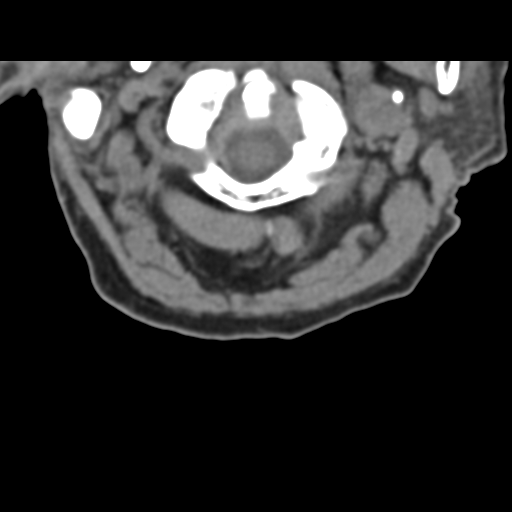
[im 67/81  bone]
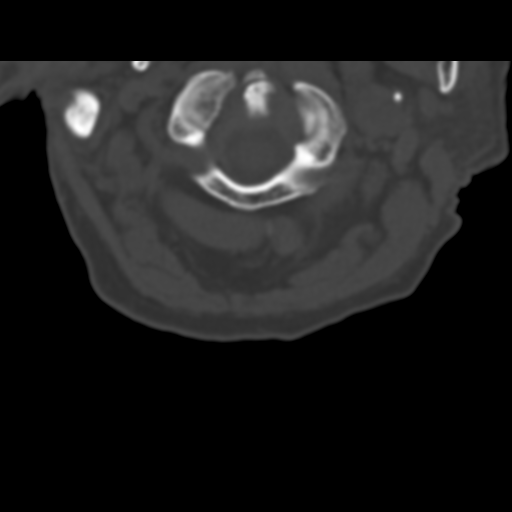

[Series 9: coronal · coronal · 0.30mm/px · 2 of 61 slices shown]
[im 21/61  bone]
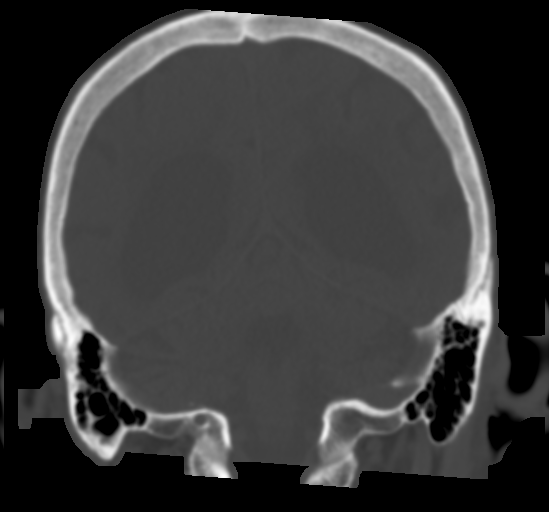
[im 41/61  bone]
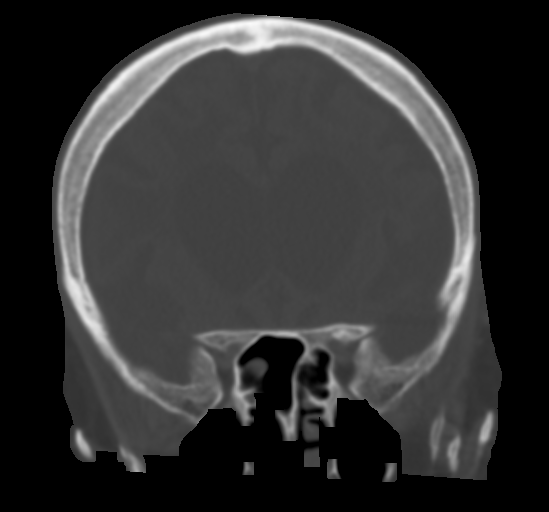

[Series 10: sagittal · sagittal · 0.29mm/px · 5 of 48 slices shown]
[im 7/48  bone]
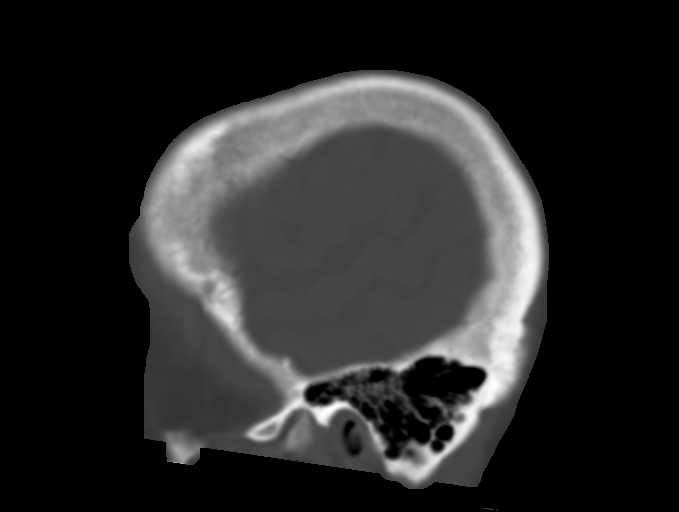
[im 14/48  bone]
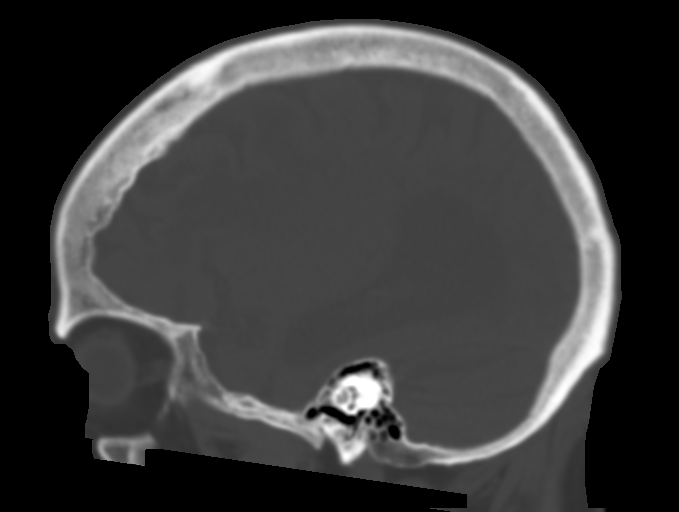
[im 21/48  bone]
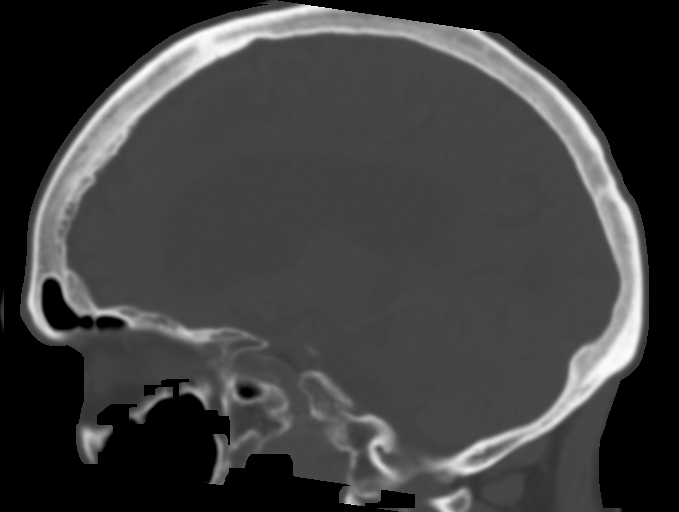
[im 27/48  bone]
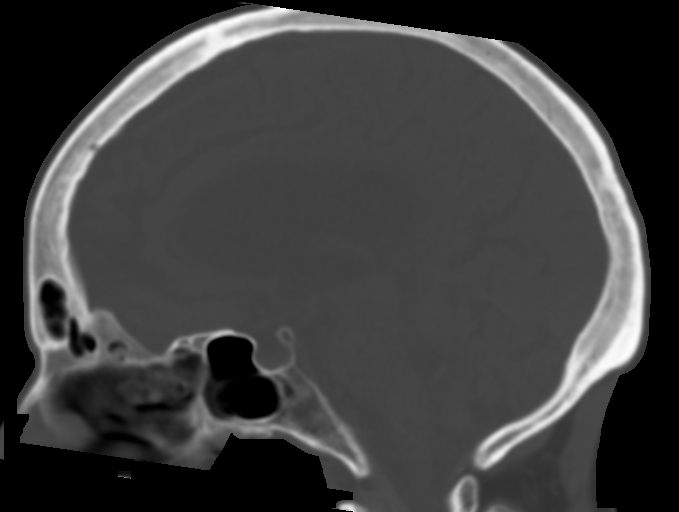
[im 34/48  bone]
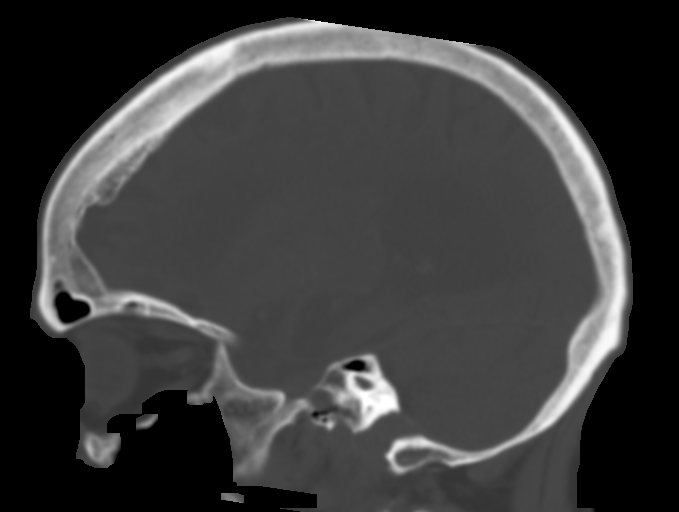

[14 of 33 positions shown; findings below may reference images not displayed]

FINDINGS: CT HEAD FINDINGS

Brain: No evidence of acute infarction, hemorrhage, obstructive
hydrocephalus, or mass lesion/mass effect. Advanced generalized
atrophy with ventriculomegaly. Prominent mesial temporal volume loss
correlating with Alzheimer's disease history. Chronic small vessel
disease with confluent ischemic gliosis, greater in the frontal
regions.

Vascular: Atherosclerotic calcification.

Skull: Posterior scalp swelling without calvarial fracture.

Sinuses/Orbits: No acute finding.  Right cataract resection.

Other: None.

CT CERVICAL SPINE FINDINGS

Alignment: No traumatic malalignment. Mild C4-5 anterolisthesis
which is considered degenerative.

Skull base and vertebrae: No acute fracture. C5-6 ACDF with solid
fusion.

Soft tissues and canal: No prevertebral fluid. No gross canal
hematoma.

Upper chest: No acute finding
IMPRESSION: 1. No evidence of acute intracranial or cervical spine injury.
2. Scalp swelling without calvarial fracture.
3. Advanced atrophy correlating with dementia history. Advanced
chronic microvascular disease.

## 2018-02-13 IMAGING — CR DG CHEST 1V PORT
1 series · 1 of 1 positions shown · non-contrast
Comparison: None.

CLINICAL DATA: Altered mental status.  History of dementia.

EXAM:
PORTABLE CHEST 1 VIEW

[AP]
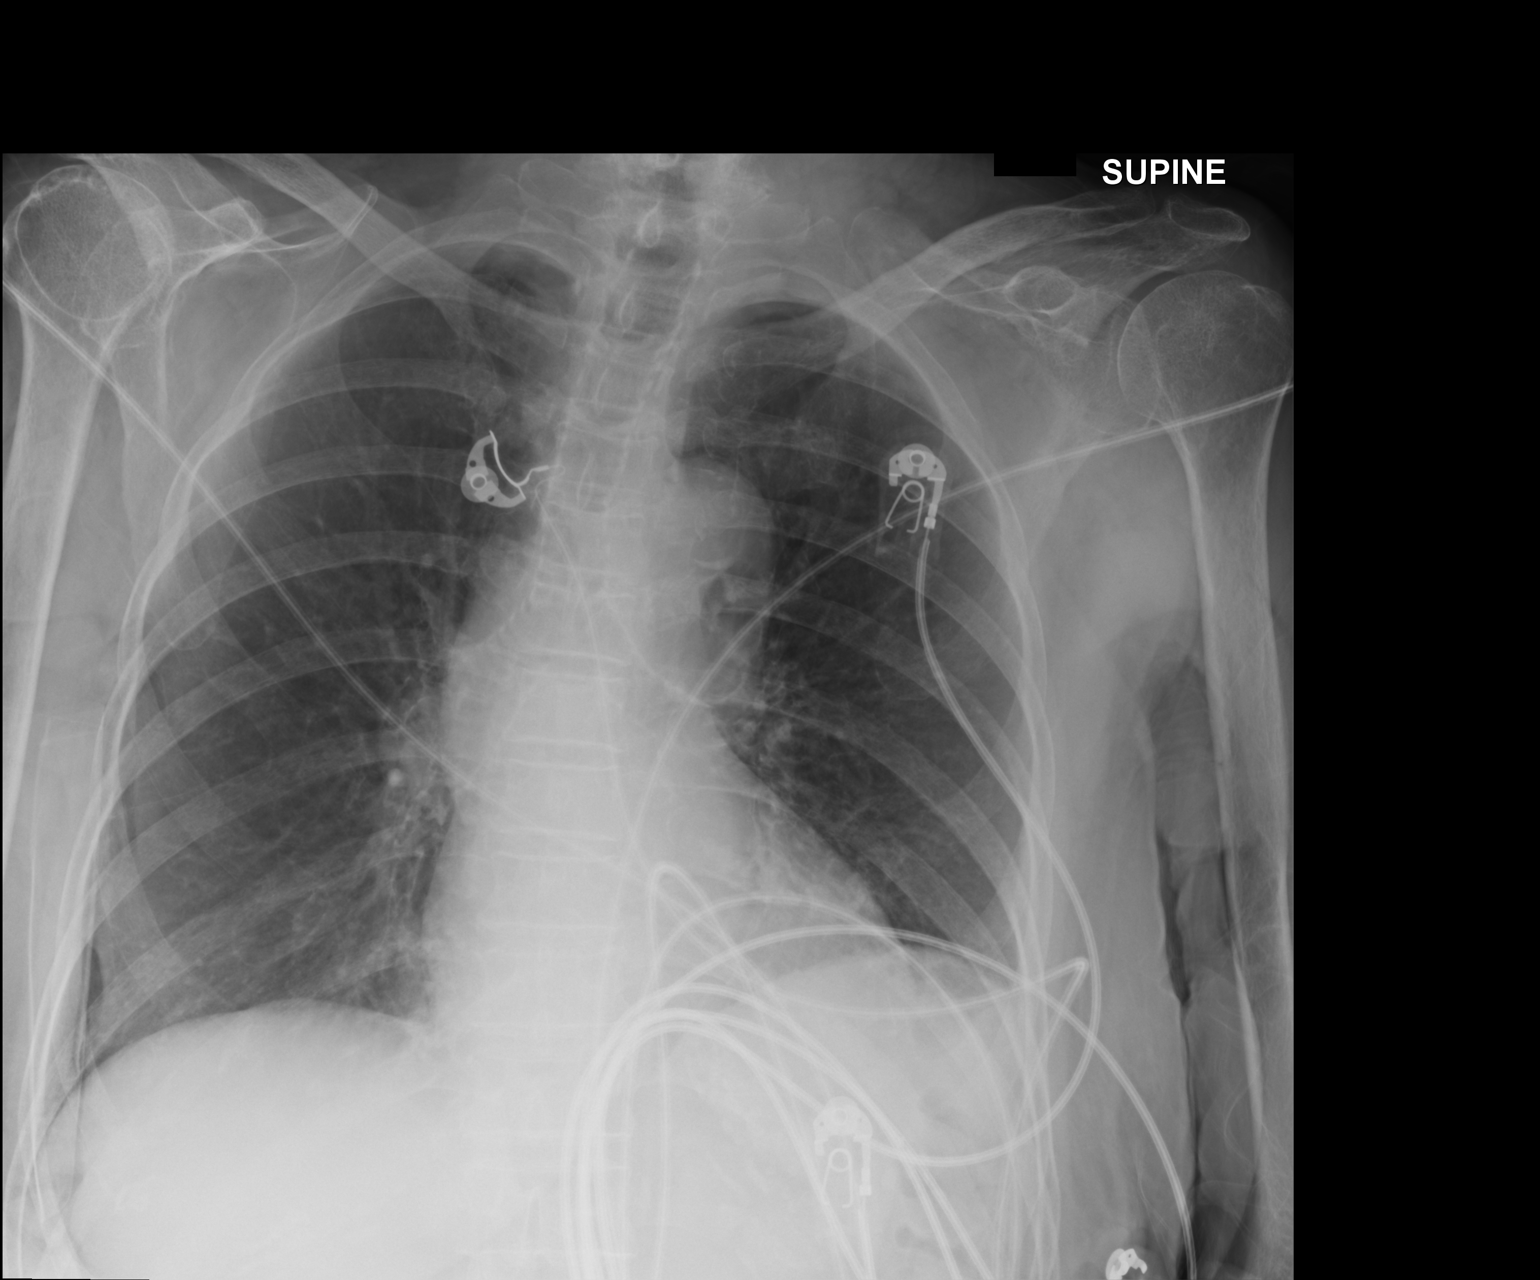

[1 of 1 positions shown; findings below may reference images not displayed]

FINDINGS: Examination is degraded due to positioning in the patient's
overlying right upper extremity.

Normal cardiac silhouette and mediastinal contours with
atherosclerotic plaque within the thoracic aorta. Minimal bibasilar
opacities, left greater than right, favored to represent
atelectasis. No pleural effusion or pneumothorax. No evidence of
edema. No acute osseus abnormalities.
IMPRESSION: 1. Degraded examination without acute cardiopulmonary disease.
Further evaluation with a PA and lateral chest radiograph may be
obtained as clinically indicated.
2.  Aortic Atherosclerosis ([id number])
